# Patient Record
Sex: Male | Born: 1950
Health system: Southern US, Community
[De-identification: ages and names within clinical notes are randomized; demographics above are authoritative.]

## PROBLEM LIST (undated history)

## (undated) ENCOUNTER — Emergency Department (HOSPITAL_COMMUNITY): Admission: EM | Payer: Medicare Other | Source: Home / Self Care

## (undated) DIAGNOSIS — I10 Essential (primary) hypertension: Secondary | ICD-10-CM

## (undated) DIAGNOSIS — N200 Calculus of kidney: Secondary | ICD-10-CM

## (undated) DIAGNOSIS — E785 Hyperlipidemia, unspecified: Secondary | ICD-10-CM

## (undated) DIAGNOSIS — J189 Pneumonia, unspecified organism: Secondary | ICD-10-CM

## (undated) HISTORY — PX: LITHOTRIPSY: SUR834

## (undated) HISTORY — PX: WISDOM TOOTH EXTRACTION: SHX21

---

## 1999-06-07 ENCOUNTER — Encounter: Payer: Self-pay | Admitting: Emergency Medicine

## 1999-06-07 ENCOUNTER — Emergency Department (HOSPITAL_COMMUNITY): Admission: EM | Admit: 1999-06-07 | Discharge: 1999-06-07 | Payer: Self-pay | Admitting: Emergency Medicine

## 1999-06-09 ENCOUNTER — Emergency Department (HOSPITAL_COMMUNITY): Admission: EM | Admit: 1999-06-09 | Discharge: 1999-06-09 | Payer: Self-pay | Admitting: Emergency Medicine

## 1999-06-12 ENCOUNTER — Ambulatory Visit (HOSPITAL_COMMUNITY): Admission: RE | Admit: 1999-06-12 | Discharge: 1999-06-12 | Payer: Self-pay | Admitting: Urology

## 1999-06-12 ENCOUNTER — Encounter: Payer: Self-pay | Admitting: Urology

## 2000-10-02 ENCOUNTER — Emergency Department (HOSPITAL_COMMUNITY): Admission: EM | Admit: 2000-10-02 | Discharge: 2000-10-03 | Payer: Self-pay | Admitting: Emergency Medicine

## 2003-08-24 ENCOUNTER — Ambulatory Visit (HOSPITAL_COMMUNITY): Admission: RE | Admit: 2003-08-24 | Discharge: 2003-08-24 | Payer: Self-pay | Admitting: Family Medicine

## 2003-09-14 ENCOUNTER — Ambulatory Visit (HOSPITAL_COMMUNITY): Admission: RE | Admit: 2003-09-14 | Discharge: 2003-09-14 | Payer: Self-pay | Admitting: General Surgery

## 2011-02-14 ENCOUNTER — Encounter: Payer: Self-pay | Admitting: *Deleted

## 2011-02-14 ENCOUNTER — Emergency Department (HOSPITAL_COMMUNITY)
Admission: EM | Admit: 2011-02-14 | Discharge: 2011-02-14 | Disposition: A | Discharge status: Home / Self Care | Payer: BC Managed Care – PPO | Attending: Emergency Medicine | Admitting: Emergency Medicine

## 2011-02-14 ENCOUNTER — Emergency Department (HOSPITAL_COMMUNITY): Payer: BC Managed Care – PPO

## 2011-02-14 DIAGNOSIS — I10 Essential (primary) hypertension: Secondary | ICD-10-CM | POA: Insufficient documentation

## 2011-02-14 DIAGNOSIS — N200 Calculus of kidney: Secondary | ICD-10-CM

## 2011-02-14 DIAGNOSIS — E785 Hyperlipidemia, unspecified: Secondary | ICD-10-CM | POA: Insufficient documentation

## 2011-02-14 DIAGNOSIS — Z87891 Personal history of nicotine dependence: Secondary | ICD-10-CM | POA: Insufficient documentation

## 2011-02-14 HISTORY — DX: Hyperlipidemia, unspecified: E78.5

## 2011-02-14 HISTORY — DX: Calculus of kidney: N20.0

## 2011-02-14 HISTORY — DX: Essential (primary) hypertension: I10

## 2011-02-14 LAB — BASIC METABOLIC PANEL
GFR calc Af Amer: 71 mL/min — ABNORMAL LOW (ref >90)
GFR calc non Af Amer: 61 mL/min — ABNORMAL LOW (ref >90)
Glucose, Bld: 139 mg/dL — ABNORMAL HIGH (ref 70–99)
Potassium: 4.3 mEq/L (ref 3.5–5.1)
Sodium: 140 mEq/L (ref 135–145)

## 2011-02-14 LAB — DIFFERENTIAL
Basophils Relative: 0 % (ref 0–1)
Eosinophils Absolute: 0.1 10*3/uL (ref 0.0–0.7)
Lymphs Abs: 1.1 10*3/uL (ref 0.7–4.0)
Neutrophils Relative %: 80 % — ABNORMAL HIGH (ref 43–77)

## 2011-02-14 LAB — URINE MICROSCOPIC-ADD ON

## 2011-02-14 LAB — URINALYSIS, ROUTINE W REFLEX MICROSCOPIC
Nitrite: NEGATIVE
Specific Gravity, Urine: >1.03 — ABNORMAL HIGH (ref 1.005–1.030)
Urobilinogen, UA: 0.2 mg/dL (ref 0.0–1.0)

## 2011-02-14 LAB — CBC
MCH: 30.2 pg (ref 26.0–34.0)
Platelets: 184 10*3/uL (ref 150–400)
RBC: 4.7 MIL/uL (ref 4.22–5.81)

## 2011-02-14 MED ORDER — HYDROMORPHONE HCL PF 1 MG/ML IJ SOLN
1.0000 mg | Freq: Once | INTRAMUSCULAR | Status: AC
  Administered 2011-02-14: 1 mg via INTRAVENOUS
  Filled 2011-02-14: qty 1

## 2011-02-14 MED ORDER — ONDANSETRON HCL 4 MG/2ML IJ SOLN
4.0000 mg | Freq: Once | INTRAMUSCULAR | Status: AC
  Administered 2011-02-14: 4 mg via INTRAVENOUS
  Filled 2011-02-14: qty 2

## 2011-02-14 MED ORDER — ONDANSETRON HCL 8 MG PO TABS: 4.0000 mg | ORAL_TABLET | Freq: Four times a day (QID) | ORAL | Status: AC

## 2011-02-14 MED ORDER — SODIUM CHLORIDE 0.9 % IV BOLUS (SEPSIS)
500.0000 mL | Freq: Once | INTRAVENOUS | Status: AC
  Administered 2011-02-14: 500 mL via INTRAVENOUS

## 2011-02-14 MED ORDER — CIPROFLOXACIN HCL 250 MG PO TABS
500.0000 mg | ORAL_TABLET | Freq: Once | ORAL | Status: AC
  Administered 2011-02-14: 500 mg via ORAL
  Filled 2011-02-14: qty 2

## 2011-02-14 MED ORDER — CIPROFLOXACIN HCL 500 MG PO TABS: 500.0000 mg | ORAL_TABLET | Freq: Two times a day (BID) | ORAL | Status: AC

## 2011-02-14 MED ORDER — OXYCODONE-ACETAMINOPHEN 5-325 MG PO TABS: 1.0000 | ORAL_TABLET | ORAL | Status: AC | PRN

## 2011-02-14 NOTE — ED Notes (Signed)
Urine sample obtained and sent to lab with results pending.  Wife at bedside.  No needs voiced.

## 2011-02-14 NOTE — ED Notes (Signed)
EDP at bedside discussing plan of care for discharge.  Family verbalized understanding.

## 2011-02-14 NOTE — ED Notes (Signed)
Pt c/o right side flank pain. Pt states pain woke him from sleep.

## 2011-02-14 NOTE — ED Notes (Signed)
EDP at bedside discussing plan of care with pt and family.

## 2011-02-14 NOTE — ED Provider Notes (Signed)
History     CSN: [id number] Arrival date & time: 02/14/2011  9:33 AM   First MD Initiated Contact with Patient 02/14/11 [phone]      Chief Complaint  Patient presents with  . Flank Pain    (Consider location/radiation/quality/duration/timing/severity/associated sxs/prior treatment) HPI Comments: Patient c/o right flank pain that began suddenly this morning and woke him for sleep.  Also reports having nausea and vomiting x 1 earlier.  Describes the pain as sharp and radiating to his right lower abd and groin. Also had some urinary hesistancy this morning  Had a kidney stone approximately 10 years ago and this pain today feels similar.  He denies chest pain, diaphoresis, weakness, hematuria or numbness.    Patient is a 60 y.o. male presenting with flank pain. The history is provided by the patient.  Flank Pain This is a new problem. The current episode started today. The problem occurs constantly. The problem has been unchanged. Associated symptoms include abdominal pain, nausea, urinary symptoms and vomiting. Pertinent negatives include no arthralgias, chest pain, chills, coughing, diaphoresis, fatigue, fever, myalgias, neck pain, numbness, rash, sore throat or weakness. The symptoms are aggravated by nothing. He has tried nothing for the symptoms. The treatment provided no relief.    Past Medical History  Diagnosis Date  . Kidney stone   . Hyperlipidemia   . Hypertension     Past Surgical History  Procedure Date  . Lithotripsy     History reviewed. No pertinent family history.  History  Substance Use Topics  . Smoking status: Former Games developer  . Smokeless tobacco: Not on file  . Alcohol Use: No      Review of Systems  Constitutional: Negative for fever, chills, diaphoresis and fatigue.  HENT: Negative for sore throat, trouble swallowing, neck pain and neck stiffness.   Respiratory: Negative for cough, shortness of breath and wheezing.   Cardiovascular: Negative for chest  pain and palpitations.  Gastrointestinal: Positive for nausea, vomiting and abdominal pain. Negative for abdominal distention and rectal pain.  Genitourinary: Positive for flank pain, decreased urine volume and difficulty urinating. Negative for dysuria and hematuria.  Musculoskeletal: Negative for myalgias, back pain and arthralgias.  Skin: Negative.  Negative for rash.  Neurological: Negative for dizziness, weakness and numbness.  Hematological: Does not bruise/bleed easily.  All other systems reviewed and are negative.    Allergies  Review of patient's allergies indicates no known allergies.  Home Medications  No current outpatient prescriptions on file.  BP 121/83  Pulse 65  Temp(Src) 97.4 F (36.3 C) (Oral)  Resp 18  Ht 5\' 7"  (1.702 m)  Wt 210 lb (95.255 kg)  BMI 32.89 kg/m2  SpO2 97%  Physical Exam  Nursing note and vitals reviewed. Constitutional: He is oriented to person, place, and time. He appears well-developed and well-nourished. No distress.  HENT:  Head: Normocephalic and atraumatic.  Mouth/Throat: Oropharynx is clear and moist.  Neck: Normal range of motion. Neck supple.  Cardiovascular: Normal rate, regular rhythm and normal heart sounds.   Pulmonary/Chest: Effort normal and breath sounds normal. No respiratory distress. He exhibits no tenderness.  Abdominal: Soft. He exhibits no distension and no mass. There is no hepatosplenomegaly. There is no tenderness. There is CVA tenderness. There is no rebound and no guarding.  Musculoskeletal: Normal range of motion. He exhibits no edema and no tenderness.  Lymphadenopathy:    He has no cervical adenopathy.  Neurological: He is alert and oriented to person, place, and time. No cranial nerve  deficit. He exhibits normal muscle tone. Coordination normal.  Skin: Skin is warm and dry.    ED Course  Procedures (including critical care time)    Results for orders placed during the hospital encounter of 02/14/11    CBC      Component Value Range   WBC 8.4  4.0 - 10.5 (K/uL)   RBC 4.70  4.22 - 5.81 (MIL/uL)   Hemoglobin 14.2  13.0 - 17.0 (g/dL)   HCT 40.9  81.1 - 91.4 (%)   MCV 92.1  78.0 - 100.0 (fL)   MCH 30.2  26.0 - 34.0 (pg)   MCHC 32.8  30.0 - 36.0 (g/dL)   RDW 78.2  95.6 - 21.3 (%)   Platelets 184  150 - 400 (K/uL)  DIFFERENTIAL      Component Value Range   Neutrophils Relative 80 (*) 43 - 77 (%)   Neutro Abs 6.7  1.7 - 7.7 (K/uL)   Lymphocytes Relative 13  12 - 46 (%)   Lymphs Abs 1.1  0.7 - 4.0 (K/uL)   Monocytes Relative 6  3 - 12 (%)   Monocytes Absolute 0.5  0.1 - 1.0 (K/uL)   Eosinophils Relative 1  0 - 5 (%)   Eosinophils Absolute 0.1  0.0 - 0.7 (K/uL)   Basophils Relative 0  0 - 1 (%)   Basophils Absolute 0.0  0.0 - 0.1 (K/uL)  BASIC METABOLIC PANEL      Component Value Range   Sodium 140  135 - 145 (mEq/L)   Potassium 4.3  3.5 - 5.1 (mEq/L)   Chloride 103  96 - 112 (mEq/L)   CO2 28  19 - 32 (mEq/L)   Glucose, Bld 139 (*) 70 - 99 (mg/dL)   BUN 18  6 - 23 (mg/dL)   Creatinine, Ser 0.86  0.50 - 1.35 (mg/dL)   Calcium 9.7  8.4 - 57.8 (mg/dL)   GFR calc non Af Amer 61 (*) >90 (mL/min)   GFR calc Af Amer 71 (*) >90 (mL/min)  URINALYSIS, ROUTINE W REFLEX MICROSCOPIC      Component Value Range   Color, Urine YELLOW  YELLOW    APPearance CLEAR  CLEAR    Specific Gravity, Urine >1.030 (*) 1.005 - 1.030    pH 5.5  5.0 - 8.0    Glucose, UA NEGATIVE  NEGATIVE (mg/dL)   Hgb urine dipstick SMALL (*) NEGATIVE    Bilirubin Urine NEGATIVE  NEGATIVE    Ketones, ur TRACE (*) NEGATIVE (mg/dL)   Protein, ur NEGATIVE  NEGATIVE (mg/dL)   Urobilinogen, UA 0.2  0.0 - 1.0 (mg/dL)   Nitrite NEGATIVE  NEGATIVE    Leukocytes, UA NEGATIVE  NEGATIVE   URINE MICROSCOPIC-ADD ON      Component Value Range   WBC, UA 7-10  <3 (WBC/hpf)   RBC / HPF 7-10  <3 (RBC/hpf)   Bacteria, UA FEW (*) RARE      Ct Abdomen Pelvis Wo Contrast  02/14/2011  *RADIOLOGY REPORT*  Clinical Data: Right flank  pain  CT ABDOMEN AND PELVIS WITHOUT CONTRAST  Technique:  Multidetector CT imaging of the abdomen and pelvis was performed following the standard protocol without intravenous contrast.  Comparison: None.  Findings:  Renal:  There is  renal edema and mild hydronephrosis on the right. There is right hydroureter.  This is secondary to an obstructing calculus in the distal right ureter measuring 2 mm (image 84). This stone is approximately 1 cm from the right vesicoureteral  junction.  There is a cluster of three nonobstructing 1-2 mm calculi within lower pole of the right kidney.  Additional small calculi in the upper pole of the right kidney.  There is one small punctate calculus within the left kidney.  No evidence of left hydroureter or obstructive uropathy.  Lung bases are clear.  No pericardial fluid.  Non-IV contrast images demonstrate no focal hepatic lesion.  The gallbladder, pancreas, spleen, adrenal glands are normal.  The stomach, small bowel, appendix, and colon are normal.  Abdominal aorta normal caliber.  No retroperitoneal lymphadenopathy.  No free fluid the pelvis.  Bladder is decompressed.  Prostate gland is normal.  Review of  bone windows demonstrates no aggressive osseous lesions. Sclerotic lesions within the right sacrum appear benign.  IMPRESSION:  1. Small calculus within the distal right ureter with mild to moderate obstructive uropathy. 2.  Bilateral nephrolithiasis, right greater than left.  Original Report Authenticated By: Genevive Bi, M.D.     MDM    10:07 AM patient appears umcomfortable.  Has sudden onset of right flank pain this morning with hx of previous kidney stone.  Will address his pain, order labs and CT of abd/pelvis  12:04 PM patient feeling better.  Pain has improved.  Has a 2mm right stone and 7-10 WBC's and few bact. in his urine.  I will have him strain his urine, urine culture is pending.  Will give pain medications, anti-emetic and cipro.  He agrees to f/u with  his urologist    Patient / Family / Caregiver understand and agree with initial ED impression and plan with expectations set for ED visit.   Pt feels improved after observation and/or treatment in ED.         Terril Chestnut L. [place], [place] 02/14/11 2121

## 2011-02-15 LAB — URINE CULTURE

## 2011-02-15 NOTE — ED Provider Notes (Signed)
Medical screening examination/treatment/procedure(s) were performed by non-physician practitioner and as supervising physician I was immediately available for consultation/collaboration. Devoria Albe, MD, Armando Gang   Ward Givens, MD 02/15/11 [phone]

## 2016-07-28 DIAGNOSIS — E6609 Other obesity due to excess calories: Secondary | ICD-10-CM | POA: Diagnosis not present

## 2016-07-28 DIAGNOSIS — I10 Essential (primary) hypertension: Secondary | ICD-10-CM | POA: Diagnosis not present

## 2016-07-28 DIAGNOSIS — Z125 Encounter for screening for malignant neoplasm of prostate: Secondary | ICD-10-CM | POA: Diagnosis not present

## 2016-07-28 DIAGNOSIS — E782 Mixed hyperlipidemia: Secondary | ICD-10-CM | POA: Diagnosis not present

## 2016-07-28 DIAGNOSIS — Z6831 Body mass index (BMI) 31.0-31.9, adult: Secondary | ICD-10-CM | POA: Diagnosis not present

## 2016-07-28 DIAGNOSIS — Z0000 Encounter for general adult medical examination without abnormal findings: Secondary | ICD-10-CM | POA: Diagnosis not present

## 2016-07-28 DIAGNOSIS — Z1389 Encounter for screening for other disorder: Secondary | ICD-10-CM | POA: Diagnosis not present

## 2017-06-02 ENCOUNTER — Ambulatory Visit (HOSPITAL_COMMUNITY)
Admission: RE | Admit: 2017-06-02 | Discharge: 2017-06-02 | Disposition: A | Discharge status: Home / Self Care | Payer: Medicare Other | Source: Ambulatory Visit | Attending: Physician Assistant | Admitting: Physician Assistant

## 2017-06-02 ENCOUNTER — Other Ambulatory Visit (HOSPITAL_COMMUNITY): Payer: Self-pay | Admitting: Physician Assistant

## 2017-06-02 DIAGNOSIS — R0781 Pleurodynia: Secondary | ICD-10-CM

## 2017-06-02 DIAGNOSIS — X58XXXA Exposure to other specified factors, initial encounter: Secondary | ICD-10-CM | POA: Insufficient documentation

## 2017-06-02 DIAGNOSIS — Z6832 Body mass index (BMI) 32.0-32.9, adult: Secondary | ICD-10-CM | POA: Diagnosis not present

## 2017-06-02 DIAGNOSIS — J9811 Atelectasis: Secondary | ICD-10-CM | POA: Insufficient documentation

## 2017-06-02 DIAGNOSIS — E6609 Other obesity due to excess calories: Secondary | ICD-10-CM | POA: Diagnosis not present

## 2017-06-02 DIAGNOSIS — S2241XA Multiple fractures of ribs, right side, initial encounter for closed fracture: Secondary | ICD-10-CM | POA: Insufficient documentation

## 2017-06-02 DIAGNOSIS — Z1389 Encounter for screening for other disorder: Secondary | ICD-10-CM | POA: Diagnosis not present

## 2017-06-19 ENCOUNTER — Emergency Department (HOSPITAL_COMMUNITY): Payer: Medicare Other

## 2017-06-19 ENCOUNTER — Other Ambulatory Visit: Payer: Self-pay

## 2017-06-19 ENCOUNTER — Emergency Department (HOSPITAL_COMMUNITY)
Admission: EM | Admit: 2017-06-19 | Discharge: 2017-06-19 | Disposition: A | Discharge status: Home / Self Care | Payer: Medicare Other | Attending: Emergency Medicine | Admitting: Emergency Medicine

## 2017-06-19 ENCOUNTER — Encounter (HOSPITAL_COMMUNITY): Payer: Self-pay | Admitting: Student

## 2017-06-19 DIAGNOSIS — Z7982 Long term (current) use of aspirin: Secondary | ICD-10-CM | POA: Insufficient documentation

## 2017-06-19 DIAGNOSIS — Z87891 Personal history of nicotine dependence: Secondary | ICD-10-CM | POA: Diagnosis not present

## 2017-06-19 DIAGNOSIS — J189 Pneumonia, unspecified organism: Secondary | ICD-10-CM

## 2017-06-19 DIAGNOSIS — J181 Lobar pneumonia, unspecified organism: Secondary | ICD-10-CM | POA: Diagnosis not present

## 2017-06-19 DIAGNOSIS — X58XXXD Exposure to other specified factors, subsequent encounter: Secondary | ICD-10-CM | POA: Diagnosis not present

## 2017-06-19 DIAGNOSIS — S2241XD Multiple fractures of ribs, right side, subsequent encounter for fracture with routine healing: Secondary | ICD-10-CM | POA: Insufficient documentation

## 2017-06-19 DIAGNOSIS — R079 Chest pain, unspecified: Secondary | ICD-10-CM | POA: Diagnosis present

## 2017-06-19 DIAGNOSIS — S2241XA Multiple fractures of ribs, right side, initial encounter for closed fracture: Secondary | ICD-10-CM | POA: Diagnosis not present

## 2017-06-19 MED ORDER — AMOXICILLIN 500 MG PO CAPS: 1000.0000 mg | ORAL_CAPSULE | Freq: Two times a day (BID) | ORAL | 0 refills | Status: DC

## 2017-06-19 MED ORDER — AMOXICILLIN 250 MG PO CAPS
1000.0000 mg | ORAL_CAPSULE | Freq: Once | ORAL | Status: AC
  Administered 2017-06-19: 1000 mg via ORAL
  Filled 2017-06-19: qty 4

## 2017-06-19 NOTE — ED Provider Notes (Signed)
[facility] EMERGENCY DEPARTMENT Provider Note   CSN: [id number] Arrival date & time: 06/19/17  2110     History   Chief Complaint Chief Complaint  Patient presents with  . Chest Pain    HPI Frank Gilmore is a 67 y.o. male.  The history is provided by the patient.  Chest Pain    He has history of hypertension, hyperlipidemia, kidney stones, and had fallen 3 weeks ago suffering fractures to 2 ribs on his right side.  Over the last 2 days, he has developed fever to 100.9.  He is also been having chills and sweats at night.  There has been a cough which is nonproductive.  He does not have any true dyspnea, only pain when he takes a deep breath.  He denies nausea or vomiting.  He denies any sick contacts.  Past Medical History:  Diagnosis Date  . Hyperlipidemia   . Hypertension   . Kidney stone     There are no active problems to display for this patient.   Past Surgical History:  Procedure Laterality Date  . LITHOTRIPSY          Home Medications    Prior to Admission medications   Medication Sig Start Date End Date Taking? Authorizing Provider  amoxicillin (AMOXIL) 500 MG capsule Take 2 capsules (1,000 mg total) by mouth 2 (two) times daily. 08/15/58   Delora Fuel, MD  aspirin EC 81 MG tablet Take 81 mg by mouth daily.      [provider]  losartan (COZAAR) 100 MG tablet Take 100 mg by mouth daily.      [provider]  Multiple Vitamins-Minerals (MULTIVITAMINS THER. W/MINERALS) TABS Take 1 tablet by mouth daily.      [provider]  naproxen sodium (ANAPROX) 220 MG tablet Take 220 mg by mouth every 8 (eight) hours as needed. For pain     [provider]    Family History History reviewed. No pertinent family history.  Social History Social History   Tobacco Use  . Smoking status: Former Smoker  Substance Use Topics  . Alcohol use: No  . Drug use: No     Allergies   Patient has no known allergies.   Review of  Systems Review of Systems  Cardiovascular: Positive for chest pain.  All other systems reviewed and are negative.    Physical Exam Updated Vital Signs Pulse 93   Temp 98.9 F (37.2 C) (Oral)   Resp 20   Ht 5\' 9"  (1.753 m)   Wt 99.8 kg (220 lb)   SpO2 95%   BMI 32.49 kg/m   Physical Exam  Nursing note and vitals reviewed.  67 year old male, resting comfortably and in no acute distress. Vital signs are normal. Oxygen saturation is 95%, which is normal. Head is normocephalic and atraumatic. PERRLA, EOMI. Oropharynx is clear. Neck is nontender and supple without adenopathy or JVD. Back is nontender and there is no CVA tenderness. Lungs have rales at the right base without wheezes or rhonchi. Chest has moderate tenderness in the right lateral rib cage. Heart has regular rate and rhythm without murmur. Abdomen is soft, flat, nontender without masses or hepatosplenomegaly and peristalsis is normoactive. Extremities have no cyanosis or edema, full range of motion is present. Skin is warm and dry without rash. Neurologic: Mental status is normal, cranial nerves are intact, there are no motor or sensory deficits.  ED Treatments / Results   Radiology Dg Ribs Unilateral  W/chest Right  Result Date: 06/19/2017 CLINICAL DATA:  Broken ribs on 05/30/2017. Shortness of breath for 1 week. Fever, chills. EXAM: RIGHT RIBS AND CHEST - 3+ VIEW COMPARISON:  06/02/2017 FINDINGS: Shallow inspiration with linear atelectasis in the lung bases, increasing since previous study. Heart size and pulmonary vascularity are normal. No airspace disease in the lungs. No blunting of costophrenic angles. No pneumothorax. Mediastinal contours appear intact. Nondisplaced fractures are again demonstrated in the right seventh eighth and ninth ribs laterally. This corresponds to the area of patient's pain. No evidence of significant displacement since the previous study. No expansile or destructive rib lesions. IMPRESSION:  Shallow inspiration with linear atelectasis in the lung bases, increasing since previous study. Nondisplaced fractures again demonstrated in the right seventh, eighth, and ninth ribs laterally, similar to previous study. Electronically Signed   By: Lucienne Capers M.D.   On: 06/19/2017 22:17    Procedures Procedures  Medications Ordered in ED Medications  amoxicillin (AMOXIL) capsule 1,000 mg (1,000 mg Oral Given 06/19/17 2315)     Initial Impression / Assessment and Plan / ED Course  I have reviewed the triage vital signs and the nursing notes.  Pertinent imaging results that were available during my care of the patient were reviewed by me and considered in my medical decision making (see chart for details).  Fever and cough in patient with recent rib fracture.  Old records are reviewed confirming ED visit on March 17 for fall with 2 rib fractures on the right.  This is suspicious for pneumonia.  Rib x-rays were ordered from triage today, and are now showing evidence of fracture and 3 ribs and atelectasis at the bases.  I reviewed the images, and I feel that he actually has pneumonia at the right base.  He is nontoxic in appearance and maintaining good oxygen saturation on room air, so I do not feel he needs hospitalization.  He is given a prescription for amoxicillin and advised to continue routine care of rib fractures.  Also advised to take full, deep breaths several times a day to try to reduce atelectasis.  Given note to be off from work for 2 days, return if he develops high fever or develops dyspnea.  Final Clinical Impressions(s) / ED Diagnoses   Final diagnoses:  Community acquired pneumonia of right lower lobe of lung ([place])  Closed fracture of three ribs of right side, with routine healing, subsequent encounter    ED Discharge Orders        Ordered    amoxicillin (AMOXIL) 500 MG capsule  2 times daily     85/88/50 2774       Delora Fuel, MD 12/87/86 2319

## 2017-06-19 NOTE — ED Triage Notes (Addendum)
Pt reports broken ribs on 05/30/17. Pt c/o chills, fever with highest temp being 100.9, and SOB for 1 week. Pt took 4 ibuprofen around 1900. Pt reports chest congestion as well.

## 2017-06-19 NOTE — Discharge Instructions (Addendum)
Continue to take acetaminophen or ibuprofen for fever and pain. Several times a day, take a full, deep breat - even if it hurts.  Return if you develop a high fever, or if you are getting more short of breath.

## 2017-08-03 DIAGNOSIS — I10 Essential (primary) hypertension: Secondary | ICD-10-CM | POA: Diagnosis not present

## 2017-08-03 DIAGNOSIS — Z6832 Body mass index (BMI) 32.0-32.9, adult: Secondary | ICD-10-CM | POA: Diagnosis not present

## 2017-08-03 DIAGNOSIS — M79606 Pain in leg, unspecified: Secondary | ICD-10-CM | POA: Diagnosis not present

## 2017-08-03 DIAGNOSIS — E7800 Pure hypercholesterolemia, unspecified: Secondary | ICD-10-CM | POA: Diagnosis not present

## 2017-08-03 DIAGNOSIS — M25569 Pain in unspecified knee: Secondary | ICD-10-CM | POA: Diagnosis not present

## 2017-08-13 DIAGNOSIS — N429 Disorder of prostate, unspecified: Secondary | ICD-10-CM | POA: Diagnosis not present

## 2017-08-13 DIAGNOSIS — Z79899 Other long term (current) drug therapy: Secondary | ICD-10-CM | POA: Diagnosis not present

## 2017-08-17 DIAGNOSIS — Z6832 Body mass index (BMI) 32.0-32.9, adult: Secondary | ICD-10-CM | POA: Diagnosis not present

## 2017-08-17 DIAGNOSIS — Z87442 Personal history of urinary calculi: Secondary | ICD-10-CM | POA: Diagnosis not present

## 2017-08-17 DIAGNOSIS — E7800 Pure hypercholesterolemia, unspecified: Secondary | ICD-10-CM | POA: Diagnosis not present

## 2017-08-17 DIAGNOSIS — I10 Essential (primary) hypertension: Secondary | ICD-10-CM | POA: Diagnosis not present

## 2017-08-17 DIAGNOSIS — M25569 Pain in unspecified knee: Secondary | ICD-10-CM | POA: Diagnosis not present

## 2017-08-17 DIAGNOSIS — M5432 Sciatica, left side: Secondary | ICD-10-CM | POA: Diagnosis not present

## 2017-08-17 DIAGNOSIS — M79606 Pain in leg, unspecified: Secondary | ICD-10-CM | POA: Diagnosis not present

## 2017-11-08 DIAGNOSIS — E7800 Pure hypercholesterolemia, unspecified: Secondary | ICD-10-CM | POA: Diagnosis not present

## 2017-11-08 DIAGNOSIS — Z6832 Body mass index (BMI) 32.0-32.9, adult: Secondary | ICD-10-CM | POA: Diagnosis not present

## 2017-11-08 DIAGNOSIS — I10 Essential (primary) hypertension: Secondary | ICD-10-CM | POA: Diagnosis not present

## 2017-11-08 DIAGNOSIS — M25569 Pain in unspecified knee: Secondary | ICD-10-CM | POA: Diagnosis not present

## 2017-11-08 DIAGNOSIS — Z125 Encounter for screening for malignant neoplasm of prostate: Secondary | ICD-10-CM | POA: Diagnosis not present

## 2017-11-08 DIAGNOSIS — M79606 Pain in leg, unspecified: Secondary | ICD-10-CM | POA: Diagnosis not present

## 2017-11-12 DIAGNOSIS — E7800 Pure hypercholesterolemia, unspecified: Secondary | ICD-10-CM | POA: Diagnosis not present

## 2017-11-12 DIAGNOSIS — I10 Essential (primary) hypertension: Secondary | ICD-10-CM | POA: Diagnosis not present

## 2017-11-12 DIAGNOSIS — M5432 Sciatica, left side: Secondary | ICD-10-CM | POA: Diagnosis not present

## 2017-11-12 DIAGNOSIS — M79605 Pain in left leg: Secondary | ICD-10-CM | POA: Diagnosis not present

## 2017-11-12 DIAGNOSIS — Z6832 Body mass index (BMI) 32.0-32.9, adult: Secondary | ICD-10-CM | POA: Diagnosis not present

## 2017-11-30 ENCOUNTER — Ambulatory Visit: Payer: No Typology Code available for payment source

## 2017-11-30 ENCOUNTER — Telehealth: Payer: Self-pay

## 2017-11-30 NOTE — Telephone Encounter (Signed)
noted 

## 2017-11-30 NOTE — Telephone Encounter (Signed)
PATIENT WAS A NO SHOW AND LETTER SENT  °

## 2018-07-01 DIAGNOSIS — E782 Mixed hyperlipidemia: Secondary | ICD-10-CM | POA: Diagnosis not present

## 2018-07-01 DIAGNOSIS — I10 Essential (primary) hypertension: Secondary | ICD-10-CM | POA: Diagnosis not present

## 2018-08-10 DIAGNOSIS — Z0000 Encounter for general adult medical examination without abnormal findings: Secondary | ICD-10-CM | POA: Diagnosis not present

## 2018-09-10 ENCOUNTER — Emergency Department (HOSPITAL_COMMUNITY): Payer: Medicare Other

## 2018-09-10 ENCOUNTER — Encounter (HOSPITAL_COMMUNITY): Payer: Self-pay | Admitting: Emergency Medicine

## 2018-09-10 ENCOUNTER — Other Ambulatory Visit: Payer: Self-pay

## 2018-09-10 ENCOUNTER — Emergency Department (HOSPITAL_COMMUNITY)
Admission: EM | Admit: 2018-09-10 | Discharge: 2018-09-10 | Disposition: A | Discharge status: Home / Self Care | Payer: Medicare Other | Attending: Emergency Medicine | Admitting: Emergency Medicine

## 2018-09-10 DIAGNOSIS — R319 Hematuria, unspecified: Secondary | ICD-10-CM | POA: Insufficient documentation

## 2018-09-10 DIAGNOSIS — S2241XA Multiple fractures of ribs, right side, initial encounter for closed fracture: Secondary | ICD-10-CM | POA: Diagnosis not present

## 2018-09-10 DIAGNOSIS — S32038A Other fracture of third lumbar vertebra, initial encounter for closed fracture: Secondary | ICD-10-CM | POA: Diagnosis not present

## 2018-09-10 DIAGNOSIS — I10 Essential (primary) hypertension: Secondary | ICD-10-CM | POA: Insufficient documentation

## 2018-09-10 DIAGNOSIS — Z79899 Other long term (current) drug therapy: Secondary | ICD-10-CM | POA: Diagnosis not present

## 2018-09-10 DIAGNOSIS — S32009A Unspecified fracture of unspecified lumbar vertebra, initial encounter for closed fracture: Secondary | ICD-10-CM

## 2018-09-10 DIAGNOSIS — Y93H9 Activity, other involving exterior property and land maintenance, building and construction: Secondary | ICD-10-CM | POA: Insufficient documentation

## 2018-09-10 DIAGNOSIS — Z23 Encounter for immunization: Secondary | ICD-10-CM | POA: Insufficient documentation

## 2018-09-10 DIAGNOSIS — Z7982 Long term (current) use of aspirin: Secondary | ICD-10-CM | POA: Diagnosis not present

## 2018-09-10 DIAGNOSIS — Z87891 Personal history of nicotine dependence: Secondary | ICD-10-CM | POA: Insufficient documentation

## 2018-09-10 DIAGNOSIS — W11XXXA Fall on and from ladder, initial encounter: Secondary | ICD-10-CM | POA: Insufficient documentation

## 2018-09-10 DIAGNOSIS — Y999 Unspecified external cause status: Secondary | ICD-10-CM | POA: Diagnosis not present

## 2018-09-10 DIAGNOSIS — S3991XA Unspecified injury of abdomen, initial encounter: Secondary | ICD-10-CM | POA: Diagnosis not present

## 2018-09-10 DIAGNOSIS — S32019A Unspecified fracture of first lumbar vertebra, initial encounter for closed fracture: Secondary | ICD-10-CM | POA: Diagnosis not present

## 2018-09-10 DIAGNOSIS — S32018A Other fracture of first lumbar vertebra, initial encounter for closed fracture: Secondary | ICD-10-CM | POA: Diagnosis not present

## 2018-09-10 DIAGNOSIS — Y92007 Garden or yard of unspecified non-institutional (private) residence as the place of occurrence of the external cause: Secondary | ICD-10-CM | POA: Insufficient documentation

## 2018-09-10 DIAGNOSIS — S32028A Other fracture of second lumbar vertebra, initial encounter for closed fracture: Secondary | ICD-10-CM | POA: Diagnosis not present

## 2018-09-10 LAB — COMPREHENSIVE METABOLIC PANEL
ALT: 68 U/L — ABNORMAL HIGH (ref 0–44)
AST: 73 U/L — ABNORMAL HIGH (ref 15–41)
Albumin: 4.7 g/dL (ref 3.5–5.0)
Alkaline Phosphatase: 68 U/L (ref 38–126)
Anion gap: 13 (ref 5–15)
BUN: 20 mg/dL (ref 8–23)
CO2: 23 mmol/L (ref 22–32)
Calcium: 9.3 mg/dL (ref 8.9–10.3)
Chloride: 103 mmol/L (ref 98–111)
Creatinine, Ser: 1.29 mg/dL — ABNORMAL HIGH (ref 0.61–1.24)
GFR calc Af Amer: >60 mL/min (ref >60)
GFR calc non Af Amer: 57 mL/min — ABNORMAL LOW (ref >60)
Glucose, Bld: 115 mg/dL — ABNORMAL HIGH (ref 70–99)
Potassium: 4.1 mmol/L (ref 3.5–5.1)
Sodium: 139 mmol/L (ref 135–145)
Total Bilirubin: 0.7 mg/dL (ref 0.3–1.2)
Total Protein: 7.7 g/dL (ref 6.5–8.1)

## 2018-09-10 LAB — CBC WITH DIFFERENTIAL/PLATELET
Abs Immature Granulocytes: 0.04 10*3/uL (ref 0.00–0.07)
Basophils Absolute: 0.1 10*3/uL (ref 0.0–0.1)
Basophils Relative: 1 %
Eosinophils Absolute: 0.1 10*3/uL (ref 0.0–0.5)
Eosinophils Relative: 1 %
HCT: 46.5 % (ref 39.0–52.0)
Hemoglobin: 15.2 g/dL (ref 13.0–17.0)
Immature Granulocytes: 0 %
Lymphocytes Relative: 11 %
Lymphs Abs: 1.4 10*3/uL (ref 0.7–4.0)
MCH: 31.3 pg (ref 26.0–34.0)
MCHC: 32.7 g/dL (ref 30.0–36.0)
MCV: 95.7 fL (ref 80.0–100.0)
Monocytes Absolute: 0.9 10*3/uL (ref 0.1–1.0)
Monocytes Relative: 6 %
Neutro Abs: 10.9 10*3/uL — ABNORMAL HIGH (ref 1.7–7.7)
Neutrophils Relative %: 81 %
Platelets: 149 10*3/uL — ABNORMAL LOW (ref 150–400)
RBC: 4.86 MIL/uL (ref 4.22–5.81)
RDW: 13.1 % (ref 11.5–15.5)
WBC: 13.3 10*3/uL — ABNORMAL HIGH (ref 4.0–10.5)
nRBC: 0 % (ref 0.0–0.2)

## 2018-09-10 LAB — URINALYSIS, ROUTINE W REFLEX MICROSCOPIC
Bilirubin Urine: NEGATIVE
Glucose, UA: NEGATIVE mg/dL
Ketones, ur: NEGATIVE mg/dL
Leukocytes,Ua: NEGATIVE
Nitrite: NEGATIVE
Protein, ur: NEGATIVE mg/dL
RBC / HPF: >50 RBC/hpf — ABNORMAL HIGH (ref 0–5)
Specific Gravity, Urine: 1.024 (ref 1.005–1.030)
pH: 5 (ref 5.0–8.0)

## 2018-09-10 MED ORDER — OXYCODONE-ACETAMINOPHEN 5-325 MG PO TABS: 1.0000 | ORAL_TABLET | ORAL | 0 refills | Status: DC | PRN

## 2018-09-10 MED ORDER — TETANUS-DIPHTH-ACELL PERTUSSIS 5-2.5-18.5 LF-MCG/0.5 IM SUSP
0.5000 mL | Freq: Once | INTRAMUSCULAR | Status: AC
  Administered 2018-09-10: 0.5 mL via INTRAMUSCULAR
  Filled 2018-09-10: qty 0.5

## 2018-09-10 MED ORDER — IOHEXOL 300 MG/ML  SOLN
100.0000 mL | Freq: Once | INTRAMUSCULAR | Status: AC | PRN
  Administered 2018-09-10: 100 mL via INTRAVENOUS

## 2018-09-10 NOTE — ED Notes (Signed)
Order addition/change noted   Frank Gilmore in to speak w pt

## 2018-09-10 NOTE — ED Notes (Signed)
Pt to restroom

## 2018-09-10 NOTE — ED Notes (Signed)
Pt with abrasion to R lower back   Ambulates heel to toe  Alert  Denies urinary frequency or other complaints

## 2018-09-10 NOTE — ED Notes (Signed)
Pt is speaking w spouse regarding care

## 2018-09-10 NOTE — ED Triage Notes (Addendum)
Patient c/o right mid to right flank pain after falling from 2 step on ladder landing on wooden deck chair. Per patient felt pop afterwards while taking a shower in right flank. Denies any shortness of breath, nausea, or vomiting. Denies hitting head or LOC. Denies taking any type of anticoagulants. No complications with BMs or urination. CNS intact. Ambulatory.

## 2018-09-10 NOTE — ED Provider Notes (Signed)
1650  Pt signed out to me by Evalee Jefferson, PA-C at end of shift.  CT chest and abd/pelvis still pending.    Pt has right flank pain secondary to a fall from a ladder.  Missed one step and fell back landing against a wooden seat on his deck.  No tachycardia, dyspnea.  He is resting comfortably.  Vitals reviewed.     Ct Chest W Contrast  Result Date: 09/10/2018 CLINICAL DATA:  68 year old male with right flank pain after falling from a ladder onto a wooden deck chair. EXAM: CT CHEST, ABDOMEN, AND PELVIS WITH CONTRAST TECHNIQUE: Multidetector CT imaging of the chest, abdomen and pelvis was performed following the standard protocol during bolus administration of intravenous contrast. CONTRAST:  126mL OMNIPAQUE IOHEXOL 300 MG/ML  SOLN COMPARISON:  Prior CT abdomen/pelvis 02/14/2011 FINDINGS: CT CHEST FINDINGS Cardiovascular: Conventional 3 vessel aortic arch. No evidence of aneurysm or dissection. The heart is normal in size. No pericardial effusion. Calcifications are present along the left main and left anterior descending coronary artery. Normal caliber main pulmonary artery. No PE. Mediastinum/Nodes: Unremarkable CT appearance of the thyroid gland. No suspicious mediastinal or hilar adenopathy. No soft tissue mediastinal mass. The thoracic esophagus is unremarkable. Lungs/Pleura: Lungs are clear. No pleural effusion or pneumothorax. Musculoskeletal: Acute minimally displaced fractures of the lateral aspect of the right seventh rib in the anterolateral aspect of the right eighth rib. Subtle nondisplaced fracture of the lateral aspect of the right ninth rib. Nondisplaced fracture of the neck of the right tenth rib with a displaced fracture of the posterolateral aspect. Fracture displaced by 1 full rib width. Similarly, there is a nondisplaced fracture of the rib of the right eleventh rib with a more significantly displaced fracture of the posterolateral aspect. The fracture is displaced by 2 rib widths. CT  ABDOMEN PELVIS FINDINGS Hepatobiliary: Diffuse low attenuation of the hepatic parenchyma consistent with hepatic steatosis. No evidence of hepatic contusion. Gallbladder is unremarkable. No intra or extrahepatic biliary ductal dilatation. Pancreas: Unremarkable. No pancreatic ductal dilatation or surrounding inflammatory changes. Spleen: Normal in size without focal abnormality. Adrenals/Urinary Tract: Normal adrenal glands. Multifocal bilateral nephrolithiasis. At least 9 stones are present on the right with the largest measuring 7 mm. On the left, there are at least 5 small punctate stones. Multifocal renal cortical scarring on the left. No evidence of hydronephrosis or enhancing renal mass. The ureters and bladder are unremarkable. Stomach/Bowel: Normal appendix in the right lower quadrant. No focal bowel wall thickening or evidence of obstruction. There are a few scattered colonic diverticula. Vascular/Lymphatic: Mild aortic atherosclerosis. No aneurysm or dissection. No focal venous abnormality. Reproductive: Prostate is unremarkable. Other: No evidence of ascites, retroperitoneal or peritoneal hemorrhage. Mild stranding in the subcutaneous fat overlying the right flank consistent with contusion. Musculoskeletal: Nondisplaced fracture of the right transverse process of L1. IMPRESSION: 1. Multiple right-sided rib fractures with single fractures of right ribs 7, 8 and 9 and segmental fractures of right ribs 10 and 11 with displacement of the posterolateral fracture sites. 2. Nondisplaced fracture of the right transverse process of L1. 3. Soft tissue contusion overlying the right flank. 4. No evidence of pneumothorax, subpleural hematoma, retroperitoneal or peritoneal hematoma. No solid organ injury. 5. Coronary artery calcifications including left main and left anterior descending. Please note that although the presence of coronary artery calcium documents the presence of coronary artery disease, the severity of  this disease and any potential stenosis cannot be assessed on this non-gated CT examination. Assessment for  potential risk factor modification, dietary therapy or pharmacologic therapy may be warranted, if clinically indicated. 6. Hepatic steatosis. 7. Bilateral nonobstructing nephrolithiasis. The largest stone in the right interpolar region measures up to 7 mm. 8. Partially duplicated right renal collecting system. 9.  Aortic Atherosclerosis (ICD10-170.0) Electronically Signed   By: Jacqulynn Cadet M.D.   On: 09/10/2018 17:28   Ct Abdomen Pelvis W Contrast  Result Date: 09/10/2018 CLINICAL DATA:  68 year old male with right flank pain after falling from a ladder onto a wooden deck chair. EXAM: CT CHEST, ABDOMEN, AND PELVIS WITH CONTRAST TECHNIQUE: Multidetector CT imaging of the chest, abdomen and pelvis was performed following the standard protocol during bolus administration of intravenous contrast. CONTRAST:  134mL OMNIPAQUE IOHEXOL 300 MG/ML  SOLN COMPARISON:  Prior CT abdomen/pelvis 02/14/2011 FINDINGS: CT CHEST FINDINGS Cardiovascular: Conventional 3 vessel aortic arch. No evidence of aneurysm or dissection. The heart is normal in size. No pericardial effusion. Calcifications are present along the left main and left anterior descending coronary artery. Normal caliber main pulmonary artery. No PE. Mediastinum/Nodes: Unremarkable CT appearance of the thyroid gland. No suspicious mediastinal or hilar adenopathy. No soft tissue mediastinal mass. The thoracic esophagus is unremarkable. Lungs/Pleura: Lungs are clear. No pleural effusion or pneumothorax. Musculoskeletal: Acute minimally displaced fractures of the lateral aspect of the right seventh rib in the anterolateral aspect of the right eighth rib. Subtle nondisplaced fracture of the lateral aspect of the right ninth rib. Nondisplaced fracture of the neck of the right tenth rib with a displaced fracture of the posterolateral aspect. Fracture displaced by  1 full rib width. Similarly, there is a nondisplaced fracture of the rib of the right eleventh rib with a more significantly displaced fracture of the posterolateral aspect. The fracture is displaced by 2 rib widths. CT ABDOMEN PELVIS FINDINGS Hepatobiliary: Diffuse low attenuation of the hepatic parenchyma consistent with hepatic steatosis. No evidence of hepatic contusion. Gallbladder is unremarkable. No intra or extrahepatic biliary ductal dilatation. Pancreas: Unremarkable. No pancreatic ductal dilatation or surrounding inflammatory changes. Spleen: Normal in size without focal abnormality. Adrenals/Urinary Tract: Normal adrenal glands. Multifocal bilateral nephrolithiasis. At least 9 stones are present on the right with the largest measuring 7 mm. On the left, there are at least 5 small punctate stones. Multifocal renal cortical scarring on the left. No evidence of hydronephrosis or enhancing renal mass. The ureters and bladder are unremarkable. Stomach/Bowel: Normal appendix in the right lower quadrant. No focal bowel wall thickening or evidence of obstruction. There are a few scattered colonic diverticula. Vascular/Lymphatic: Mild aortic atherosclerosis. No aneurysm or dissection. No focal venous abnormality. Reproductive: Prostate is unremarkable. Other: No evidence of ascites, retroperitoneal or peritoneal hemorrhage. Mild stranding in the subcutaneous fat overlying the right flank consistent with contusion. Musculoskeletal: Nondisplaced fracture of the right transverse process of L1. IMPRESSION: 1. Multiple right-sided rib fractures with single fractures of right ribs 7, 8 and 9 and segmental fractures of right ribs 10 and 11 with displacement of the posterolateral fracture sites. 2. Nondisplaced fracture of the right transverse process of L1. 3. Soft tissue contusion overlying the right flank. 4. No evidence of pneumothorax, subpleural hematoma, retroperitoneal or peritoneal hematoma. No solid organ  injury. 5. Coronary artery calcifications including left main and left anterior descending. Please note that although the presence of coronary artery calcium documents the presence of coronary artery disease, the severity of this disease and any potential stenosis cannot be assessed on this non-gated CT examination. Assessment for potential risk factor modification, dietary  therapy or pharmacologic therapy may be warranted, if clinically indicated. 6. Hepatic steatosis. 7. Bilateral nonobstructing nephrolithiasis. The largest stone in the right interpolar region measures up to 7 mm. 8. Partially duplicated right renal collecting system. 9.  Aortic Atherosclerosis (ICD10-170.0) Electronically Signed   By: Jacqulynn Cadet M.D.   On: 09/10/2018 17:28     Discussed CT findings with Dr. Roderic Palau and with the pt.  He is ambulatory.  Declined pain medication here.  I feel that he is appropriate for d/c home, incentive spirometer dispensed.  He agrees to close PCP f/u in 2 days and also advised to f/u with urology as well.  Pt verbalized understanding and agrees to plan.  Strict return precautions also discussed.       Kem Parkinson, PA-C 09/10/18 Marko Stai    Milton Ferguson, MD 09/13/18 1302

## 2018-09-10 NOTE — ED Notes (Signed)
Call to CT to ascertain when pt will go

## 2018-09-10 NOTE — ED Notes (Signed)
From Rad 

## 2018-09-10 NOTE — ED Notes (Signed)
Awaiting CT read.

## 2018-09-10 NOTE — ED Provider Notes (Signed)
[facility] Provider Note   CSN: [id number] Arrival date & time: 09/10/18  1318    History   Chief Complaint Chief Complaint  Patient presents with  . Fall    HPI Frank Gilmore is a 68 y.o. male with a history of hypertension, hyperlipidemia and history of kidney stones presenting with right flank pain after falling from a ladder.  He was power washing his house, standing on a ladder and he thought he was stepping off the bottom ladder rung, instead was on the second from the bottom and he fell backwards landing with his right flank against a wooden bench edge.  He has localized pain and abrasion to the site including pain with deep inspiration, he denies shortness of breath, dizziness, also no nausea or vomiting.  He denies abdominal pain.  He has had no treatment prior to arrival.  The injury occurred approximately 2 hours before arriving here.  He was taking a shower when he felt a popping sensation in the right flank and is concerned for possible rib fractures.  He denies hitting his head, denies headache, neck pain also no chest pain or shortness of breath.  He has had no treatment prior to arrival.     The history is provided by the patient.    Past Medical History:  Diagnosis Date  . Hyperlipidemia   . Hypertension   . Kidney stone     There are no active problems to display for this patient.   Past Surgical History:  Procedure Laterality Date  . LITHOTRIPSY          Home Medications    Prior to Admission medications   Medication Sig Start Date End Date Taking? Authorizing Provider  aspirin EC 81 MG tablet Take 81 mg by mouth daily.     Yes [provider]  hydrochlorothiazide (HYDRODIURIL) 12.5 MG tablet Take 12.5 mg by mouth daily. 07/01/18  Yes [provider]  losartan (COZAAR) 100 MG tablet Take 100 mg by mouth daily.     Yes [provider]  naproxen sodium (ANAPROX) 220 MG tablet Take 220 mg by mouth every 8  (eight) hours as needed. For pain    Yes [provider]  pravastatin (PRAVACHOL) 20 MG tablet Take 20 mg by mouth daily. 07/13/18  Yes [provider]  amoxicillin (AMOXIL) 500 MG capsule Take 2 capsules (1,000 mg total) by mouth 2 (two) times daily. 08/15/93   Delora Fuel, MD  Multiple Vitamins-Minerals (MULTIVITAMINS THER. W/MINERALS) TABS Take 1 tablet by mouth daily.      [provider]    Family History No family history on file.  Social History Social History   Tobacco Use  . Smoking status: Former Smoker    Types: Cigarettes  . Smokeless tobacco: Never Used  Substance Use Topics  . Alcohol use: No  . Drug use: No     Allergies   Patient has no known allergies.   Review of Systems Review of Systems  Constitutional: Negative for chills and fever.  HENT: Negative.   Eyes: Negative.   Respiratory: Negative for chest tightness and shortness of breath.   Cardiovascular: Negative for chest pain.  Gastrointestinal: Negative for abdominal pain, nausea and vomiting.  Genitourinary: Positive for flank pain.  Musculoskeletal: Negative for arthralgias, joint swelling and neck pain.  Skin: Negative.  Negative for rash and wound.  Neurological: Negative for dizziness, weakness, light-headedness, numbness and headaches.  Psychiatric/Behavioral: Negative.  Physical Exam Updated Vital Signs BP 111/77 (BP Location: Right Arm)   Pulse 71   Temp 98.2 F (36.8 C) (Oral)   Resp 18   Ht 5\' 8"  (1.727 m)   Wt 95.3 kg   SpO2 99%   BMI 31.93 kg/m   Physical Exam Vitals signs and nursing note reviewed.  Constitutional:      Appearance: He is well-developed.  HENT:     Head: Normocephalic and atraumatic.  Eyes:     Conjunctiva/sclera: Conjunctivae normal.  Neck:     Musculoskeletal: Normal range of motion.  Cardiovascular:     Rate and Rhythm: Normal rate and regular rhythm.     Heart sounds: Normal heart sounds.  Pulmonary:     Effort:  Pulmonary effort is normal.     Breath sounds: Normal breath sounds. No wheezing.  Abdominal:     General: Bowel sounds are normal. There is no distension.     Palpations: Abdomen is soft.     Tenderness: There is no abdominal tenderness. There is right CVA tenderness. There is no guarding.     Comments: Long abrasion noted over right flank.  No crepitus. No palpable rib deformity.  Lumbar and thoracic nontender.  Musculoskeletal: Normal range of motion.  Skin:    General: Skin is warm and dry.  Neurological:     Mental Status: He is alert.      ED Treatments / Results  Labs (all labs ordered are listed, but only abnormal results are displayed) Labs Reviewed  URINALYSIS, ROUTINE W REFLEX MICROSCOPIC - Abnormal; Notable for the following components:      Result Value   Hgb urine dipstick MODERATE (*)    RBC / HPF >50 (*)    Bacteria, UA RARE (*)    All other components within normal limits  CBC WITH DIFFERENTIAL/PLATELET - Abnormal; Notable for the following components:   WBC 13.3 (*)    Platelets 149 (*)    Neutro Abs 10.9 (*)    All other components within normal limits  COMPREHENSIVE METABOLIC PANEL - Abnormal; Notable for the following components:   Glucose, Bld 115 (*)    Creatinine, Ser 1.29 (*)    AST 73 (*)    ALT 68 (*)    GFR calc non Af Amer 57 (*)    All other components within normal limits    EKG None  Radiology No results found.  Procedures Procedures (including critical care time)  Medications Ordered in ED Medications  Tdap (BOOSTRIX) injection 0.5 mL (has no administration in time range)     Initial Impression / Assessment and Plan / ED Course  I have reviewed the triage vital signs and the nursing notes.  Pertinent labs & imaging results that were available during my care of the patient were reviewed by me and considered in my medical decision making (see chart for details).        Labs reviewed, creatinine elevated but stable in  comparison to prior.  Pending Ct imaging to assess for kidney trauma/rib fractures.  Pt offered pain medicine, but deferred.  Tetanus updated.  Discussed with Frank Parkinson, PA who assumes care.   Final Clinical Impressions(s) / ED Diagnoses   Final diagnoses:  None    ED Discharge Orders    None       Landis Martins 09/10/18 1637    Milton Ferguson, MD 09/13/18 1303

## 2018-09-10 NOTE — ED Notes (Signed)
To Rad 

## 2018-09-10 NOTE — ED Notes (Signed)
Missed IV attempt

## 2018-09-10 NOTE — ED Notes (Signed)
TT in to discuss w pt

## 2018-09-10 NOTE — Discharge Instructions (Signed)
Try to avoid bending, twisting and lifting.  Use the spirometer as directed to help to keep your lungs clear.  Follow-up with your primary doctor on Monday for recheck.  Also, call the urologist listed to arrange a follow-up appt as well.  Return here for any worsening symptoms

## 2018-09-12 MED FILL — Oxycodone w/ Acetaminophen Tab 5-325 MG: ORAL | Qty: 6 | Status: AC

## 2018-09-13 DIAGNOSIS — S2241XD Multiple fractures of ribs, right side, subsequent encounter for fracture with routine healing: Secondary | ICD-10-CM | POA: Diagnosis not present

## 2018-09-13 DIAGNOSIS — N200 Calculus of kidney: Secondary | ICD-10-CM | POA: Diagnosis not present

## 2018-10-14 ENCOUNTER — Other Ambulatory Visit: Payer: Self-pay

## 2018-10-20 DIAGNOSIS — H6123 Impacted cerumen, bilateral: Secondary | ICD-10-CM | POA: Diagnosis not present

## 2018-10-20 DIAGNOSIS — H9123 Sudden idiopathic hearing loss, bilateral: Secondary | ICD-10-CM | POA: Diagnosis not present

## 2018-10-24 DIAGNOSIS — E782 Mixed hyperlipidemia: Secondary | ICD-10-CM | POA: Diagnosis not present

## 2018-10-24 DIAGNOSIS — I10 Essential (primary) hypertension: Secondary | ICD-10-CM | POA: Diagnosis not present

## 2018-10-27 DIAGNOSIS — N183 Chronic kidney disease, stage 3 (moderate): Secondary | ICD-10-CM | POA: Diagnosis not present

## 2018-10-27 DIAGNOSIS — M79606 Pain in leg, unspecified: Secondary | ICD-10-CM | POA: Diagnosis not present

## 2018-10-27 DIAGNOSIS — Z87442 Personal history of urinary calculi: Secondary | ICD-10-CM | POA: Diagnosis not present

## 2018-10-27 DIAGNOSIS — I10 Essential (primary) hypertension: Secondary | ICD-10-CM | POA: Diagnosis not present

## 2018-10-27 DIAGNOSIS — Z23 Encounter for immunization: Secondary | ICD-10-CM | POA: Diagnosis not present

## 2018-10-27 DIAGNOSIS — E7800 Pure hypercholesterolemia, unspecified: Secondary | ICD-10-CM | POA: Diagnosis not present

## 2018-10-27 DIAGNOSIS — Z6832 Body mass index (BMI) 32.0-32.9, adult: Secondary | ICD-10-CM | POA: Diagnosis not present

## 2018-10-27 DIAGNOSIS — M5432 Sciatica, left side: Secondary | ICD-10-CM | POA: Diagnosis not present

## 2018-10-27 DIAGNOSIS — R0782 Intercostal pain: Secondary | ICD-10-CM | POA: Diagnosis not present

## 2018-11-21 DIAGNOSIS — Z1211 Encounter for screening for malignant neoplasm of colon: Secondary | ICD-10-CM | POA: Diagnosis not present

## 2018-11-21 DIAGNOSIS — Z1212 Encounter for screening for malignant neoplasm of rectum: Secondary | ICD-10-CM | POA: Diagnosis not present

## 2019-02-06 ENCOUNTER — Other Ambulatory Visit: Payer: Self-pay

## 2019-03-24 ENCOUNTER — Other Ambulatory Visit: Payer: Medicare Other

## 2019-03-24 ENCOUNTER — Other Ambulatory Visit: Payer: Self-pay | Admitting: Physician Assistant

## 2019-03-24 DIAGNOSIS — L03114 Cellulitis of left upper limb: Secondary | ICD-10-CM

## 2019-05-01 DIAGNOSIS — E782 Mixed hyperlipidemia: Secondary | ICD-10-CM | POA: Diagnosis not present

## 2019-05-01 DIAGNOSIS — Z6832 Body mass index (BMI) 32.0-32.9, adult: Secondary | ICD-10-CM | POA: Diagnosis not present

## 2019-05-01 DIAGNOSIS — Z0000 Encounter for general adult medical examination without abnormal findings: Secondary | ICD-10-CM | POA: Diagnosis not present

## 2019-05-01 DIAGNOSIS — M79606 Pain in leg, unspecified: Secondary | ICD-10-CM | POA: Diagnosis not present

## 2019-05-01 DIAGNOSIS — Z87442 Personal history of urinary calculi: Secondary | ICD-10-CM | POA: Diagnosis not present

## 2019-05-01 DIAGNOSIS — I10 Essential (primary) hypertension: Secondary | ICD-10-CM | POA: Diagnosis not present

## 2019-05-01 DIAGNOSIS — M5432 Sciatica, left side: Secondary | ICD-10-CM | POA: Diagnosis not present

## 2019-05-01 DIAGNOSIS — R7301 Impaired fasting glucose: Secondary | ICD-10-CM | POA: Diagnosis not present

## 2019-05-01 DIAGNOSIS — Z125 Encounter for screening for malignant neoplasm of prostate: Secondary | ICD-10-CM | POA: Diagnosis not present

## 2019-05-01 DIAGNOSIS — E7800 Pure hypercholesterolemia, unspecified: Secondary | ICD-10-CM | POA: Diagnosis not present

## 2019-05-01 DIAGNOSIS — M25569 Pain in unspecified knee: Secondary | ICD-10-CM | POA: Diagnosis not present

## 2019-05-01 DIAGNOSIS — M79605 Pain in left leg: Secondary | ICD-10-CM | POA: Diagnosis not present

## 2019-05-05 DIAGNOSIS — E782 Mixed hyperlipidemia: Secondary | ICD-10-CM | POA: Diagnosis not present

## 2019-05-05 DIAGNOSIS — R7301 Impaired fasting glucose: Secondary | ICD-10-CM | POA: Diagnosis not present

## 2019-05-05 DIAGNOSIS — D72829 Elevated white blood cell count, unspecified: Secondary | ICD-10-CM | POA: Diagnosis not present

## 2019-05-05 DIAGNOSIS — I129 Hypertensive chronic kidney disease with stage 1 through stage 4 chronic kidney disease, or unspecified chronic kidney disease: Secondary | ICD-10-CM | POA: Diagnosis not present

## 2019-05-05 DIAGNOSIS — N182 Chronic kidney disease, stage 2 (mild): Secondary | ICD-10-CM | POA: Diagnosis not present

## 2019-05-05 DIAGNOSIS — R7303 Prediabetes: Secondary | ICD-10-CM | POA: Diagnosis not present

## 2019-06-05 IMAGING — DX DG RIBS W/ CHEST 3+V*R*
5 series · 5 of 5 positions shown · non-contrast
Comparison: Chest radiograph 08/24/2003.

CLINICAL DATA: 66-year-old male status post fall 3 days ago with
right anterior rib pain. Pleuritic pain.

EXAM:
RIGHT RIBS AND CHEST - 3+ VIEW

[chest pa]
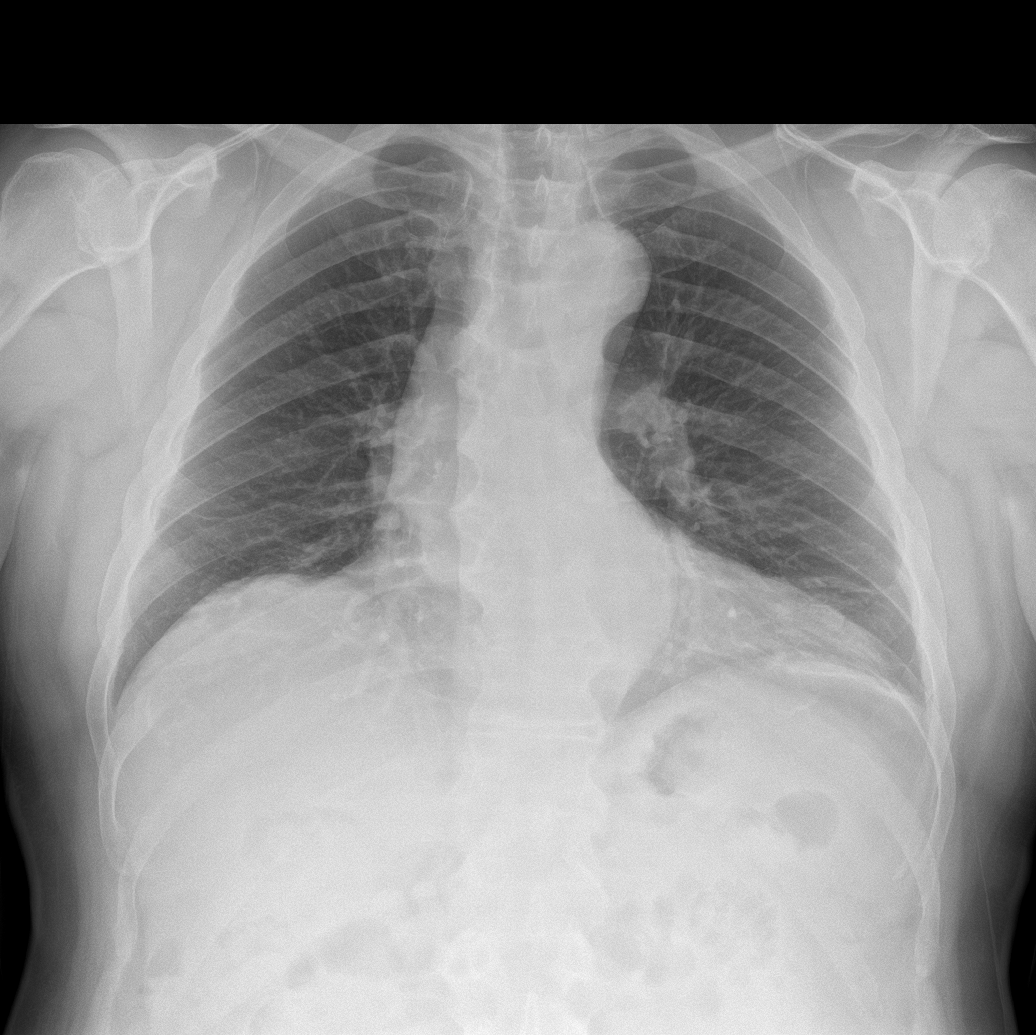

[rib pa (1 of 2)]
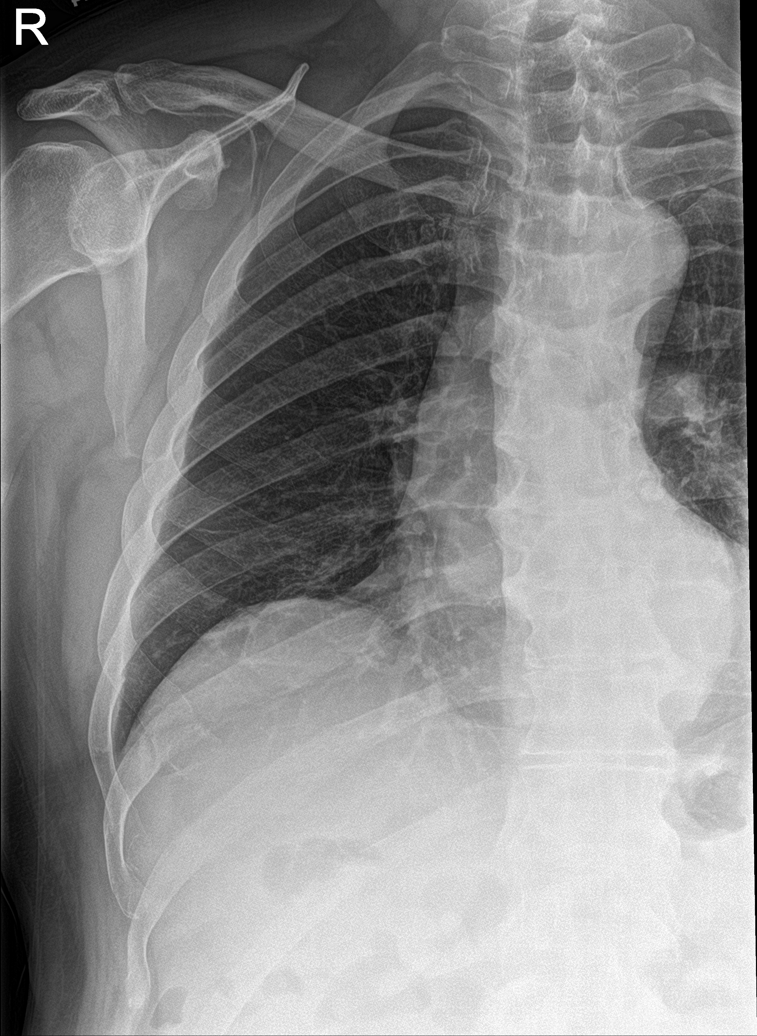

[rib pa obl (1 of 2)]
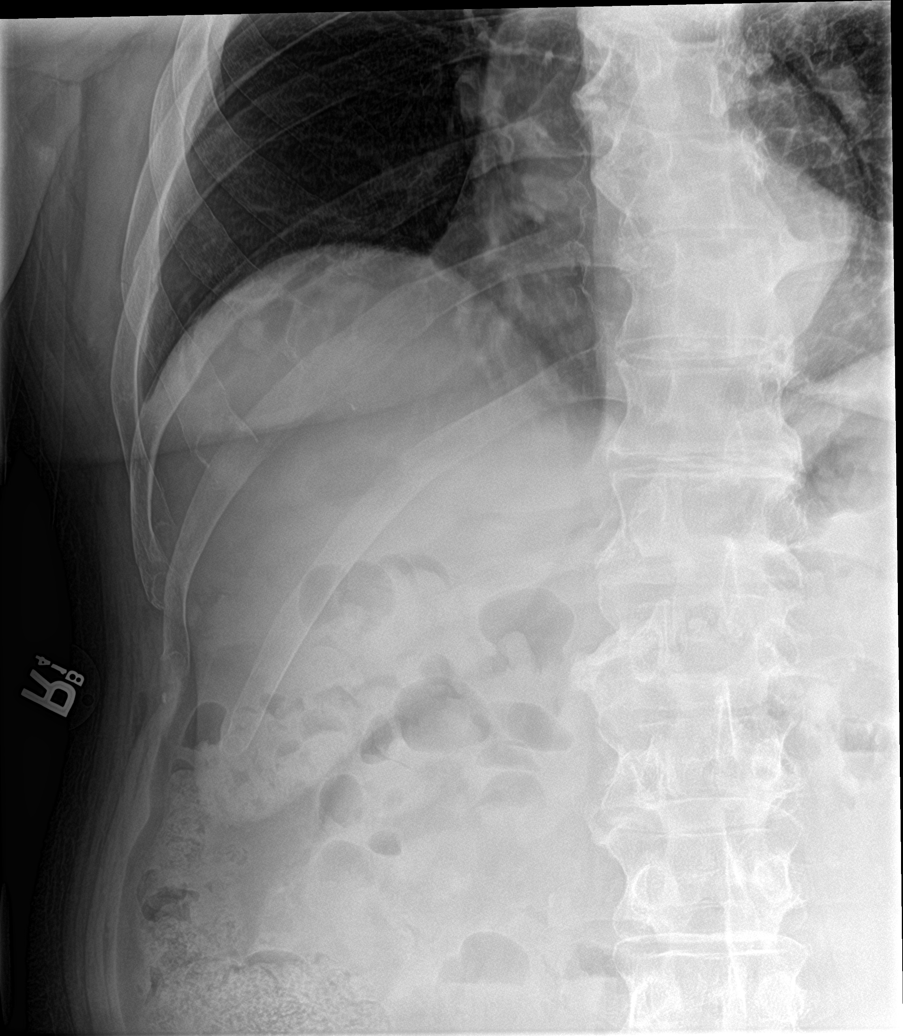

[rib pa obl (2 of 2)]
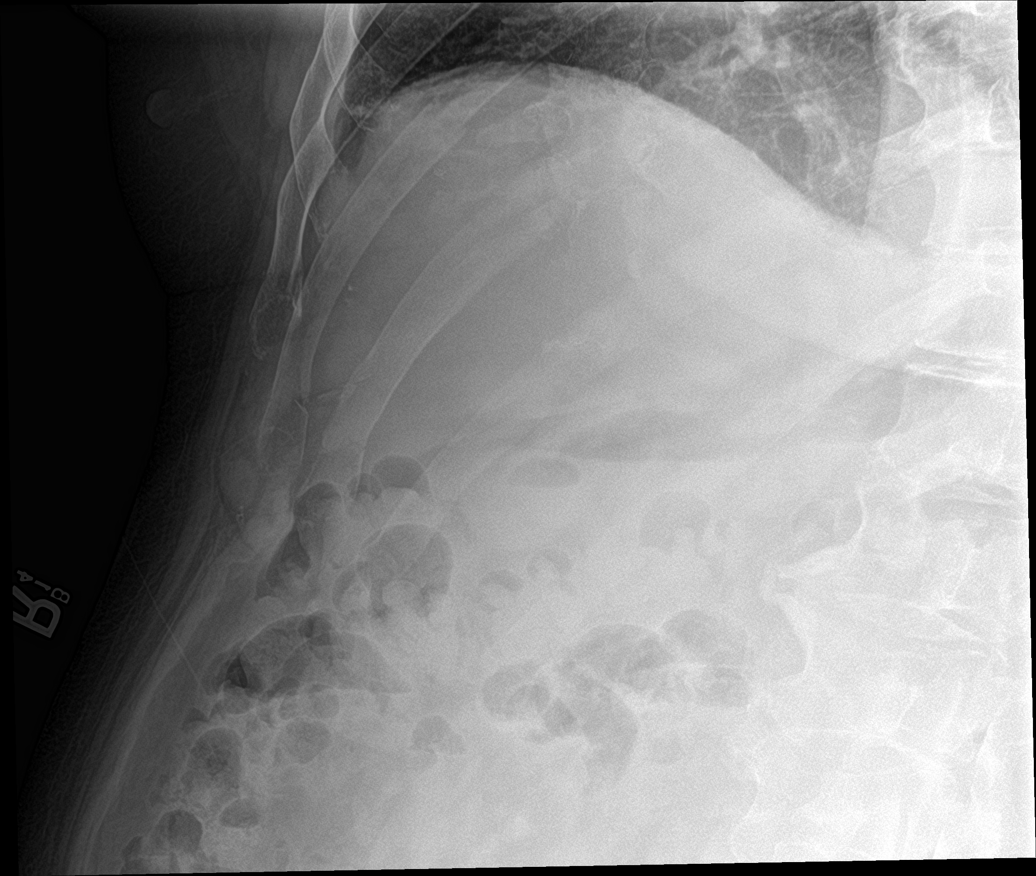

[rib pa (2 of 2)]
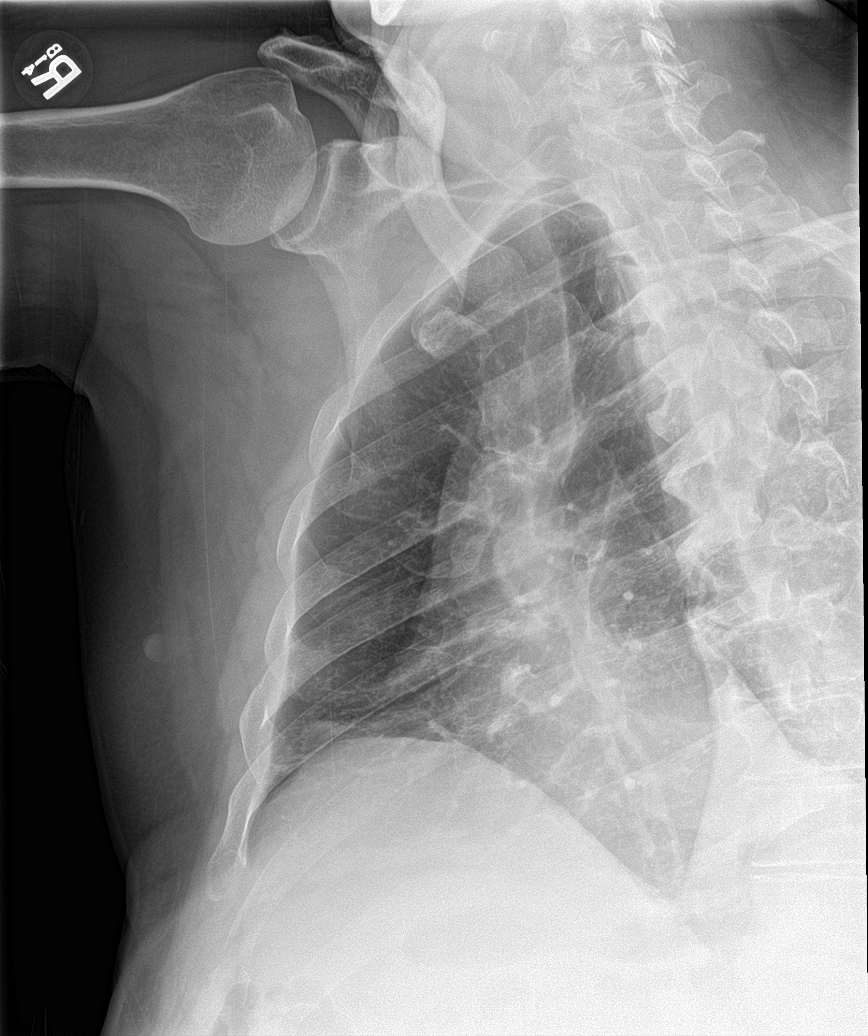

[5 of 5 positions shown; findings below may reference images not displayed]

FINDINGS: Stable lung volumes. Stable cardiac size and mediastinal contours.
Visualized tracheal air column is within normal limits. No
pneumothorax, pulmonary edema, pleural effusion or consolidation.
Mild streaky left lung base opacity most resembles atelectasis.

Bone mineralization is within normal limits for age. There are
nondisplaced fractures of both the right anterolateral 7th and 8th
rib suspected (images 3, 4). Possible superimposed nondisplaced
right anterior 8th rib fracture near the costochondral junction
(image 5). Other visible osseous structures appear intact. Negative
visible bowel gas pattern.
IMPRESSION: 1. Nondisplaced fractures of the right 7th and 8th ribs. No
associated pneumothorax or pleural effusion.
2. Mild left lung base atelectasis.

## 2019-12-01 DIAGNOSIS — Z23 Encounter for immunization: Secondary | ICD-10-CM | POA: Diagnosis not present

## 2020-04-05 DIAGNOSIS — J0100 Acute maxillary sinusitis, unspecified: Secondary | ICD-10-CM | POA: Diagnosis not present

## 2020-04-05 DIAGNOSIS — Z20822 Contact with and (suspected) exposure to covid-19: Secondary | ICD-10-CM | POA: Diagnosis not present

## 2020-04-05 DIAGNOSIS — R051 Acute cough: Secondary | ICD-10-CM | POA: Diagnosis not present

## 2020-09-10 DIAGNOSIS — I10 Essential (primary) hypertension: Secondary | ICD-10-CM | POA: Diagnosis not present

## 2020-09-10 DIAGNOSIS — R7301 Impaired fasting glucose: Secondary | ICD-10-CM | POA: Diagnosis not present

## 2020-09-26 DIAGNOSIS — J069 Acute upper respiratory infection, unspecified: Secondary | ICD-10-CM | POA: Diagnosis not present

## 2020-09-26 DIAGNOSIS — N182 Chronic kidney disease, stage 2 (mild): Secondary | ICD-10-CM | POA: Diagnosis not present

## 2020-09-26 DIAGNOSIS — I10 Essential (primary) hypertension: Secondary | ICD-10-CM | POA: Diagnosis not present

## 2020-09-26 DIAGNOSIS — Z87442 Personal history of urinary calculi: Secondary | ICD-10-CM | POA: Diagnosis not present

## 2020-09-26 DIAGNOSIS — Z125 Encounter for screening for malignant neoplasm of prostate: Secondary | ICD-10-CM | POA: Diagnosis not present

## 2020-09-26 DIAGNOSIS — E782 Mixed hyperlipidemia: Secondary | ICD-10-CM | POA: Diagnosis not present

## 2020-09-26 DIAGNOSIS — R7303 Prediabetes: Secondary | ICD-10-CM | POA: Diagnosis not present

## 2021-02-20 DIAGNOSIS — Z20822 Contact with and (suspected) exposure to covid-19: Secondary | ICD-10-CM | POA: Diagnosis not present

## 2021-04-01 DIAGNOSIS — Z125 Encounter for screening for malignant neoplasm of prostate: Secondary | ICD-10-CM | POA: Diagnosis not present

## 2021-04-01 DIAGNOSIS — R7303 Prediabetes: Secondary | ICD-10-CM | POA: Diagnosis not present

## 2021-04-01 DIAGNOSIS — E782 Mixed hyperlipidemia: Secondary | ICD-10-CM | POA: Diagnosis not present

## 2021-04-08 DIAGNOSIS — Z125 Encounter for screening for malignant neoplasm of prostate: Secondary | ICD-10-CM | POA: Diagnosis not present

## 2021-04-08 DIAGNOSIS — N182 Chronic kidney disease, stage 2 (mild): Secondary | ICD-10-CM | POA: Diagnosis not present

## 2021-04-08 DIAGNOSIS — R7303 Prediabetes: Secondary | ICD-10-CM | POA: Diagnosis not present

## 2021-04-08 DIAGNOSIS — I10 Essential (primary) hypertension: Secondary | ICD-10-CM | POA: Diagnosis not present

## 2021-04-08 DIAGNOSIS — Z87442 Personal history of urinary calculi: Secondary | ICD-10-CM | POA: Diagnosis not present

## 2021-04-08 DIAGNOSIS — E782 Mixed hyperlipidemia: Secondary | ICD-10-CM | POA: Diagnosis not present

## 2021-04-25 DIAGNOSIS — Z20822 Contact with and (suspected) exposure to covid-19: Secondary | ICD-10-CM | POA: Diagnosis not present

## 2021-05-12 DIAGNOSIS — Z20822 Contact with and (suspected) exposure to covid-19: Secondary | ICD-10-CM | POA: Diagnosis not present

## 2021-06-17 DIAGNOSIS — Z20822 Contact with and (suspected) exposure to covid-19: Secondary | ICD-10-CM | POA: Diagnosis not present

## 2021-06-19 DIAGNOSIS — Z20822 Contact with and (suspected) exposure to covid-19: Secondary | ICD-10-CM | POA: Diagnosis not present

## 2021-06-30 DIAGNOSIS — Z20822 Contact with and (suspected) exposure to covid-19: Secondary | ICD-10-CM | POA: Diagnosis not present

## 2021-07-21 DIAGNOSIS — Z20822 Contact with and (suspected) exposure to covid-19: Secondary | ICD-10-CM | POA: Diagnosis not present

## 2021-08-07 DIAGNOSIS — E782 Mixed hyperlipidemia: Secondary | ICD-10-CM | POA: Diagnosis not present

## 2021-08-07 DIAGNOSIS — R7303 Prediabetes: Secondary | ICD-10-CM | POA: Diagnosis not present

## 2021-08-14 DIAGNOSIS — Z87442 Personal history of urinary calculi: Secondary | ICD-10-CM | POA: Diagnosis not present

## 2021-08-14 DIAGNOSIS — N182 Chronic kidney disease, stage 2 (mild): Secondary | ICD-10-CM | POA: Diagnosis not present

## 2021-08-14 DIAGNOSIS — Z6832 Body mass index (BMI) 32.0-32.9, adult: Secondary | ICD-10-CM | POA: Diagnosis not present

## 2021-08-14 DIAGNOSIS — E669 Obesity, unspecified: Secondary | ICD-10-CM | POA: Diagnosis not present

## 2021-08-14 DIAGNOSIS — Z125 Encounter for screening for malignant neoplasm of prostate: Secondary | ICD-10-CM | POA: Diagnosis not present

## 2021-08-14 DIAGNOSIS — E782 Mixed hyperlipidemia: Secondary | ICD-10-CM | POA: Diagnosis not present

## 2021-08-14 DIAGNOSIS — R7303 Prediabetes: Secondary | ICD-10-CM | POA: Diagnosis not present

## 2021-08-14 DIAGNOSIS — L570 Actinic keratosis: Secondary | ICD-10-CM | POA: Diagnosis not present

## 2021-08-14 DIAGNOSIS — Z1211 Encounter for screening for malignant neoplasm of colon: Secondary | ICD-10-CM | POA: Diagnosis not present

## 2021-08-14 DIAGNOSIS — I10 Essential (primary) hypertension: Secondary | ICD-10-CM | POA: Diagnosis not present

## 2021-08-19 ENCOUNTER — Encounter: Payer: Self-pay | Admitting: *Deleted

## 2021-09-11 DIAGNOSIS — X32XXXA Exposure to sunlight, initial encounter: Secondary | ICD-10-CM | POA: Diagnosis not present

## 2021-09-11 DIAGNOSIS — D225 Melanocytic nevi of trunk: Secondary | ICD-10-CM | POA: Diagnosis not present

## 2021-09-11 DIAGNOSIS — B078 Other viral warts: Secondary | ICD-10-CM | POA: Diagnosis not present

## 2021-09-11 DIAGNOSIS — L821 Other seborrheic keratosis: Secondary | ICD-10-CM | POA: Diagnosis not present

## 2021-09-11 DIAGNOSIS — L570 Actinic keratosis: Secondary | ICD-10-CM | POA: Diagnosis not present

## 2022-02-17 ENCOUNTER — Encounter: Payer: Self-pay | Admitting: *Deleted

## 2022-09-05 ENCOUNTER — Emergency Department (HOSPITAL_COMMUNITY): Payer: Medicare Other

## 2022-09-05 ENCOUNTER — Other Ambulatory Visit: Payer: Self-pay

## 2022-09-05 ENCOUNTER — Inpatient Hospital Stay (HOSPITAL_COMMUNITY)
Admission: EM | Admit: 2022-09-05 | Discharge: 2022-09-08 | DRG: 445 | Disposition: A | Discharge status: Home / Self Care | Payer: Medicare Other | Attending: Internal Medicine | Admitting: Internal Medicine

## 2022-09-05 ENCOUNTER — Inpatient Hospital Stay (HOSPITAL_COMMUNITY): Payer: Medicare Other

## 2022-09-05 ENCOUNTER — Encounter (HOSPITAL_COMMUNITY): Payer: Self-pay | Admitting: Emergency Medicine

## 2022-09-05 DIAGNOSIS — K521 Toxic gastroenteritis and colitis: Secondary | ICD-10-CM | POA: Diagnosis present

## 2022-09-05 DIAGNOSIS — N132 Hydronephrosis with renal and ureteral calculous obstruction: Secondary | ICD-10-CM | POA: Diagnosis present

## 2022-09-05 DIAGNOSIS — N179 Acute kidney failure, unspecified: Secondary | ICD-10-CM | POA: Diagnosis not present

## 2022-09-05 DIAGNOSIS — T3695XA Adverse effect of unspecified systemic antibiotic, initial encounter: Secondary | ICD-10-CM | POA: Diagnosis present

## 2022-09-05 DIAGNOSIS — Z87442 Personal history of urinary calculi: Secondary | ICD-10-CM | POA: Diagnosis not present

## 2022-09-05 DIAGNOSIS — Z79899 Other long term (current) drug therapy: Secondary | ICD-10-CM | POA: Diagnosis not present

## 2022-09-05 DIAGNOSIS — Z6833 Body mass index (BMI) 33.0-33.9, adult: Secondary | ICD-10-CM

## 2022-09-05 DIAGNOSIS — R109 Unspecified abdominal pain: Secondary | ICD-10-CM | POA: Diagnosis not present

## 2022-09-05 DIAGNOSIS — K819 Cholecystitis, unspecified: Secondary | ICD-10-CM

## 2022-09-05 DIAGNOSIS — N202 Calculus of kidney with calculus of ureter: Secondary | ICD-10-CM | POA: Diagnosis not present

## 2022-09-05 DIAGNOSIS — N201 Calculus of ureter: Secondary | ICD-10-CM | POA: Diagnosis not present

## 2022-09-05 DIAGNOSIS — E669 Obesity, unspecified: Secondary | ICD-10-CM | POA: Diagnosis not present

## 2022-09-05 DIAGNOSIS — Z7982 Long term (current) use of aspirin: Secondary | ICD-10-CM

## 2022-09-05 DIAGNOSIS — K810 Acute cholecystitis: Secondary | ICD-10-CM | POA: Diagnosis not present

## 2022-09-05 DIAGNOSIS — K838 Other specified diseases of biliary tract: Secondary | ICD-10-CM | POA: Diagnosis not present

## 2022-09-05 DIAGNOSIS — N1339 Other hydronephrosis: Secondary | ICD-10-CM | POA: Diagnosis not present

## 2022-09-05 DIAGNOSIS — Z87891 Personal history of nicotine dependence: Secondary | ICD-10-CM | POA: Diagnosis not present

## 2022-09-05 DIAGNOSIS — T360X5A Adverse effect of penicillins, initial encounter: Secondary | ICD-10-CM | POA: Diagnosis present

## 2022-09-05 DIAGNOSIS — E785 Hyperlipidemia, unspecified: Secondary | ICD-10-CM | POA: Diagnosis present

## 2022-09-05 DIAGNOSIS — I10 Essential (primary) hypertension: Secondary | ICD-10-CM | POA: Diagnosis present

## 2022-09-05 DIAGNOSIS — K8000 Calculus of gallbladder with acute cholecystitis without obstruction: Secondary | ICD-10-CM | POA: Diagnosis not present

## 2022-09-05 DIAGNOSIS — K8001 Calculus of gallbladder with acute cholecystitis with obstruction: Secondary | ICD-10-CM | POA: Diagnosis not present

## 2022-09-05 DIAGNOSIS — K8020 Calculus of gallbladder without cholecystitis without obstruction: Secondary | ICD-10-CM | POA: Diagnosis not present

## 2022-09-05 DIAGNOSIS — N1330 Unspecified hydronephrosis: Secondary | ICD-10-CM | POA: Diagnosis not present

## 2022-09-05 DIAGNOSIS — R188 Other ascites: Secondary | ICD-10-CM | POA: Diagnosis not present

## 2022-09-05 DIAGNOSIS — N134 Hydroureter: Secondary | ICD-10-CM | POA: Diagnosis not present

## 2022-09-05 DIAGNOSIS — K828 Other specified diseases of gallbladder: Secondary | ICD-10-CM | POA: Diagnosis not present

## 2022-09-05 LAB — CBC WITH DIFFERENTIAL/PLATELET
Abs Immature Granulocytes: 0.08 10*3/uL — ABNORMAL HIGH (ref 0.00–0.07)
Basophils Absolute: 0.1 10*3/uL (ref 0.0–0.1)
Basophils Relative: 0 %
Eosinophils Absolute: 0.3 10*3/uL (ref 0.0–0.5)
Eosinophils Relative: 2 %
HCT: 40.1 % (ref 39.0–52.0)
Hemoglobin: 13.3 g/dL (ref 13.0–17.0)
Immature Granulocytes: 1 %
Lymphocytes Relative: 10 %
Lymphs Abs: 1.6 10*3/uL (ref 0.7–4.0)
MCH: 31.3 pg (ref 26.0–34.0)
MCHC: 33.2 g/dL (ref 30.0–36.0)
MCV: 94.4 fL (ref 80.0–100.0)
Monocytes Absolute: 1.8 10*3/uL — ABNORMAL HIGH (ref 0.1–1.0)
Monocytes Relative: 11 %
Neutro Abs: 11.6 10*3/uL — ABNORMAL HIGH (ref 1.7–7.7)
Neutrophils Relative %: 76 %
Platelets: 160 10*3/uL (ref 150–400)
RBC: 4.25 MIL/uL (ref 4.22–5.81)
RDW: 13.2 % (ref 11.5–15.5)
WBC: 15.3 10*3/uL — ABNORMAL HIGH (ref 4.0–10.5)
nRBC: 0 % (ref 0.0–0.2)

## 2022-09-05 LAB — URINALYSIS, ROUTINE W REFLEX MICROSCOPIC
Bacteria, UA: NONE SEEN
Bilirubin Urine: NEGATIVE
Glucose, UA: NEGATIVE mg/dL
Ketones, ur: 5 mg/dL — AB
Leukocytes,Ua: NEGATIVE
Nitrite: NEGATIVE
Protein, ur: 30 mg/dL — AB
Specific Gravity, Urine: 1.02 (ref 1.005–1.030)
pH: 5 (ref 5.0–8.0)

## 2022-09-05 LAB — COMPREHENSIVE METABOLIC PANEL
ALT: 27 U/L (ref 0–44)
AST: 28 U/L (ref 15–41)
Albumin: 3.8 g/dL (ref 3.5–5.0)
Alkaline Phosphatase: 54 U/L (ref 38–126)
Anion gap: 10 (ref 5–15)
BUN: 27 mg/dL — ABNORMAL HIGH (ref 8–23)
CO2: 22 mmol/L (ref 22–32)
Calcium: 9 mg/dL (ref 8.9–10.3)
Chloride: 105 mmol/L (ref 98–111)
Creatinine, Ser: 1.87 mg/dL — ABNORMAL HIGH (ref 0.61–1.24)
GFR, Estimated: 38 mL/min — ABNORMAL LOW (ref >60)
Glucose, Bld: 135 mg/dL — ABNORMAL HIGH (ref 70–99)
Potassium: 4 mmol/L (ref 3.5–5.1)
Sodium: 137 mmol/L (ref 135–145)
Total Bilirubin: 1 mg/dL (ref 0.3–1.2)
Total Protein: 7.3 g/dL (ref 6.5–8.1)

## 2022-09-05 LAB — LIPASE, BLOOD: Lipase: 31 U/L (ref 11–51)

## 2022-09-05 MED ORDER — HYDROMORPHONE HCL 1 MG/ML IJ SOLN
1.0000 mg | Freq: Once | INTRAMUSCULAR | Status: AC
  Administered 2022-09-05: 1 mg via INTRAVENOUS
  Filled 2022-09-05: qty 1

## 2022-09-05 MED ORDER — TAMSULOSIN HCL 0.4 MG PO CAPS
0.4000 mg | ORAL_CAPSULE | Freq: Every day | ORAL | Status: DC
  Administered 2022-09-05 – 2022-09-08 (×4): 0.4 mg via ORAL
  Filled 2022-09-05 (×4): qty 1

## 2022-09-05 MED ORDER — ONDANSETRON HCL 4 MG/2ML IJ SOLN
4.0000 mg | Freq: Three times a day (TID) | INTRAMUSCULAR | Status: DC | PRN
  Filled 2022-09-05: qty 2

## 2022-09-05 MED ORDER — GADOBUTROL 1 MMOL/ML IV SOLN
10.0000 mL | Freq: Once | INTRAVENOUS | Status: AC | PRN
  Administered 2022-09-05: 10 mL via INTRAVENOUS

## 2022-09-05 MED ORDER — ORAL CARE MOUTH RINSE: 15.0000 mL | OROMUCOSAL | Status: DC | PRN

## 2022-09-05 MED ORDER — ONDANSETRON HCL 4 MG/2ML IJ SOLN
4.0000 mg | Freq: Once | INTRAMUSCULAR | Status: AC
  Administered 2022-09-05: 4 mg via INTRAVENOUS
  Filled 2022-09-05: qty 2

## 2022-09-05 MED ORDER — HYDRALAZINE HCL 20 MG/ML IJ SOLN: 5.0000 mg | Freq: Three times a day (TID) | INTRAMUSCULAR | Status: DC | PRN

## 2022-09-05 MED ORDER — ONDANSETRON HCL 4 MG/2ML IJ SOLN
4.0000 mg | Freq: Once | INTRAMUSCULAR | Status: AC | PRN
  Administered 2022-09-05: 4 mg via INTRAVENOUS
  Filled 2022-09-05: qty 2

## 2022-09-05 MED ORDER — SODIUM CHLORIDE 0.9 % IV BOLUS
1000.0000 mL | Freq: Once | INTRAVENOUS | Status: AC
  Administered 2022-09-05: 1000 mL via INTRAVENOUS

## 2022-09-05 MED ORDER — MORPHINE SULFATE (PF) 4 MG/ML IV SOLN
4.0000 mg | Freq: Once | INTRAVENOUS | Status: AC
  Administered 2022-09-05: 4 mg via INTRAVENOUS
  Filled 2022-09-05: qty 1

## 2022-09-05 MED ORDER — ENOXAPARIN SODIUM 40 MG/0.4ML IJ SOSY
40.0000 mg | PREFILLED_SYRINGE | INTRAMUSCULAR | Status: DC
  Administered 2022-09-05: 40 mg via SUBCUTANEOUS
  Filled 2022-09-05: qty 0.4

## 2022-09-05 MED ORDER — SODIUM CHLORIDE 0.9 % IV SOLN: INTRAVENOUS | Status: DC

## 2022-09-05 MED ORDER — PIPERACILLIN-TAZOBACTAM 3.375 G IVPB 30 MIN
3.3750 g | Freq: Once | INTRAVENOUS | Status: AC
  Administered 2022-09-05: 3.375 g via INTRAVENOUS
  Filled 2022-09-05: qty 50

## 2022-09-05 MED ORDER — HYDROMORPHONE HCL 1 MG/ML IJ SOLN
0.5000 mg | INTRAMUSCULAR | Status: DC | PRN
  Administered 2022-09-05 (×2): 1 mg via INTRAVENOUS
  Filled 2022-09-05 (×2): qty 1

## 2022-09-05 NOTE — Consult Note (Signed)
Reason for Consult:cholecystitis Referring Physician: Dr Frank Gilmore is an 72 y.o. male.  HPI: This is a 72 year old gentleman with a history of kidney stones who reports that he started having discomfort earlier this week in Frank left side of his abdomen.  He reports passing at least 4 kidney stones over Frank past several days.  Late Thursday evening he started having right-sided abdominal pain and it got severe enough sharp enough to present to Frank emergency department at [facility].  Unfortunately their CT scanner was down so he was transferred here for further evaluation.  He has no known previous history of biliary colic.  He underwent a noncontrasted CT scan which does show hydronephrosis on Frank left side with a stone in Frank distal ureter.  He also has an extensive amount of inflammation around his gallbladder and secondary inflammation suspected of Frank hepatic flexure of Frank colon.  He reports his last colonoscopy was approximately 10 years ago.  His ultrasound shows gallbladder wall is 6 mm in size but there is no gallstones.  He is otherwise without complaints.  He denies recent weight loss, weight gain, jaundice, fevers, or chills.  He has noticed no blood in his urine or in his bowel movements  Past Medical History:  Diagnosis Date   Hyperlipidemia    Hypertension    Kidney stone     Past Surgical History:  Procedure Laterality Date   LITHOTRIPSY      History reviewed. No pertinent family history.  Social History:  reports that he has quit smoking. His smoking use included cigarettes. He has never used smokeless tobacco. He reports that he does not drink alcohol and does not use drugs.  Allergies: No Known Allergies  Medications: I have reviewed Frank Gilmore's current medications.  Results for orders placed or performed during Frank hospital encounter of 09/05/22 (from Frank past 48 hour(s))  Urinalysis, Routine w reflex microscopic -Urine, Clean Catch     Status: Abnormal    Collection Time: 09/05/22  6:01 AM  Result Value Ref Range   Color, Urine YELLOW YELLOW   APPearance CLEAR CLEAR   Specific Gravity, Urine 1.020 1.005 - 1.030   pH 5.0 5.0 - 8.0   Glucose, UA NEGATIVE NEGATIVE mg/dL   Hgb urine dipstick SMALL (A) NEGATIVE   Bilirubin Urine NEGATIVE NEGATIVE   Ketones, ur 5 (A) NEGATIVE mg/dL   Protein, ur 30 (A) NEGATIVE mg/dL   Nitrite NEGATIVE NEGATIVE   Leukocytes,Ua NEGATIVE NEGATIVE   RBC / HPF 11-20 0 - 5 RBC/hpf   WBC, UA 0-5 0 - 5 WBC/hpf   Bacteria, UA NONE SEEN NONE SEEN   Squamous Epithelial / HPF 0-5 0 - 5 /HPF   Mucus PRESENT     Comment: Performed at [facility], [address] [place], [place] [address]  Comprehensive metabolic panel     Status: Abnormal   Collection Time: 09/05/22  6:01 AM  Result Value Ref Range   Sodium 137 135 - 145 mmol/L   Potassium 4.0 3.5 - 5.1 mmol/L   Chloride 105 98 - 111 mmol/L   CO2 22 22 - 32 mmol/L   Glucose, Bld 135 (H) 70 - 99 mg/dL    Comment: Glucose reference range applies only to samples taken after fasting for at least 8 hours.   BUN 27 (H) 8 - 23 mg/dL   Creatinine, Ser 6.21 (H) 0.61 - 1.24 mg/dL   Calcium 9.0 8.9 - 30.8 mg/dL   Total  Protein 7.3 6.5 - 8.1 g/dL   Albumin 3.8 3.5 - 5.0 g/dL   AST 28 15 - 41 U/L   ALT 27 0 - 44 U/L   Alkaline Phosphatase 54 38 - 126 U/L   Total Bilirubin 1.0 0.3 - 1.2 mg/dL   GFR, Estimated 38 (L) >60 mL/min    Comment: (NOTE) Calculated using Frank CKD-EPI Creatinine Equation (2021)    Anion gap 10 5 - 15    Comment: Performed at [facility], [address], [place], [place] [address]  CBC with Differential     Status: Abnormal   Collection Time: 09/05/22  6:01 AM  Result Value Ref Range   WBC 15.3 (H) 4.0 - 10.5 K/uL   RBC 4.25 4.22 - 5.81 MIL/uL   Hemoglobin 13.3 13.0 - 17.0 g/dL   HCT 13.0 86.5 - 78.4 %   MCV 94.4 80.0 - 100.0 fL   MCH 31.3 26.0 - 34.0 pg   MCHC 33.2 30.0 - 36.0 g/dL   RDW 69.6 29.5 - 28.4 %   Platelets 160 150 - 400 K/uL    nRBC 0.0 0.0 - 0.2 %   Neutrophils Relative % 76 %   Neutro Abs 11.6 (H) 1.7 - 7.7 K/uL   Lymphocytes Relative 10 %   Lymphs Abs 1.6 0.7 - 4.0 K/uL   Monocytes Relative 11 %   Monocytes Absolute 1.8 (H) 0.1 - 1.0 K/uL   Eosinophils Relative 2 %   Eosinophils Absolute 0.3 0.0 - 0.5 K/uL   Basophils Relative 0 %   Basophils Absolute 0.1 0.0 - 0.1 K/uL   Immature Granulocytes 1 %   Abs Immature Granulocytes 0.08 (H) 0.00 - 0.07 K/uL    Comment: Performed at [facility], [address] [place], [place] [address]  Lipase, blood     Status: None   Collection Time: 09/05/22  6:01 AM  Result Value Ref Range   Lipase 31 11 - 51 U/L    Comment: Performed at [facility], [address] [place], [place] [address]    US Abdomen Limited RUQ (LIVER/GB)  Result Date: 09/05/2022 CLINICAL DATA:  Abdominal pain for 3 days. EXAM: ULTRASOUND ABDOMEN LIMITED RIGHT UPPER QUADRANT COMPARISON:  CT AP from earlier today FINDINGS: Gallbladder: Gallbladder appears distended and is filled with sludge. Gallbladder wall thickness measures up to 6 mm. No stones identified. A negative sonographic Murphy's sign was reported by Frank sonographer. Common bile duct: Diameter: 5.0 mm Liver: Increased parenchymal echogenicity. No focal liver abnormality. Portal vein is patent on color Doppler imaging with normal direction of blood flow towards Frank liver. Other: Exam detail is diminished secondary to overlying bowel gas and Gilmore's body habitus. IMPRESSION: 1. Diminished exam detail due to body habitus and overlying bowel gas. 2. Frank gallbladder is distended and filled with sludge. There is gallbladder wall thickening measuring up to 6 mm. No stones identified. Negative sonographic Murphy's sign. 3. Despite absence of gallstones and positive sonographic Murphy's sign there is considerable inflammatory change noted within Frank right upper quadrant surrounding hepatic flexure and gallbladder on Frank CT from earlier today. Those  findings remain suspicious for either acute cholecystitis or segmental colitis with secondary inflammation of Frank gallbladder. 4. Increased echogenicity of Frank liver parenchyma compatible with hepatic steatosis. Electronically Signed   By: Signa Kell M.D.   On: 09/05/2022 11:16   CT Renal Stone Study  Result Date: 09/05/2022 CLINICAL DATA:  Abdominal/flank pain.  Evaluate for kidney stone. EXAM: CT ABDOMEN AND  PELVIS WITHOUT CONTRAST TECHNIQUE: Multidetector CT imaging of Frank abdomen and pelvis was performed following Frank standard protocol without IV contrast. RADIATION DOSE REDUCTION: This exam was performed according to Frank departmental dose-optimization program which includes automated exposure control, adjustment of the mA and/or kV according to Gilmore size and/or use of iterative reconstruction technique. COMPARISON:  08/21/2018 FINDINGS: Lower chest: Bilateral pleural thickening and lower lobe subsegmental atelectasis is identified. No pleural fluid or airspace consolidation. Hepatobiliary: No suspicious liver lesion identified. There are inflammatory changes surrounding Frank gallbladder with possible gallbladder wall thickening (difficult to assess reflecting lack of IV contrast). Focal area of low density adjacent to Frank gallbladder fossa within segment 4 B is identified and is favored to represent focal fatty deposition, image 23/2. Fat stranding in is also noted extending around Frank hepatic flexure which exhibits bowel wall edema. No calcified gallstones identified. No signs of bile duct dilatation. Pancreas: Unremarkable. No pancreatic ductal dilatation or surrounding inflammatory changes. Spleen: Normal in size without focal abnormality. Adrenals/Urinary Tract: Normal adrenal glands. Bilateral nephrolithiasis. Frank largest stone is identified within Frank right renal pelvis measuring 1.3 by 0.7 cm, image 48/2. There is left hydronephrosis and hydroureter. Stone within Frank distal ureter measures 5  mm, image 83/2 no right-sided hydronephrosis or hydroureter. Bladder is normal. Stomach/Bowel: Stomach is within normal limits. Frank appendix is visualized and is normal. Short segment of wall thickening with surrounding inflammation involves Frank hepatic flexure adjacent to Frank gallbladder. Vascular/Lymphatic: Aortic atherosclerosis. No abdominopelvic adenopathy. Reproductive: Prostate is unremarkable. Other: There is a small amount of fluid along Frank peritoneal reflection at Frank level of Frank left iliac fossa, image 78/2. No focal fluid collections identified to suggest an abscess. Musculoskeletal: No acute or significant osseous findings. No acute or suspicious osseous findings. IMPRESSION: 1. Bilateral nephrolithiasis. Left-sided hydronephrosis and hydroureter secondary to 5 mm distal left ureteral calculus. 2. There are inflammatory changes surrounding Frank gallbladder. No calcified gallstones identified. If there is a clinical concern for acute cholecystitis consider further evaluation with right upper quadrant ultrasound. 3. Short segment of wall thickening with surrounding inflammation involves Frank hepatic flexure adjacent to Frank gallbladder. This is favored to represent secondary inflammation of Frank colon. Less favored would be acute diverticulitis or short segment colitis with secondary inflammation of Frank gallbladder. 4.  Aortic Atherosclerosis (ICD10-I70.0). Electronically Signed   By: Signa Kell M.D.   On: 09/05/2022 09:33    Review of Systems  All other systems reviewed and are negative.  Blood pressure (!) 159/107, pulse 83, temperature 98.2 F (36.8 C), temperature source Oral, resp. rate 17, height 5\' 8"  (1.727 m), weight 99.8 kg, SpO2 96 %. Physical Exam Constitutional:      General: He is not in acute distress.    Appearance: Normal appearance. He is not ill-appearing.  HENT:     Head: Normocephalic and atraumatic.     Right Ear: External ear normal.     Left Ear: External ear  normal.     Nose: Nose normal.  Eyes:     General: No scleral icterus.    Pupils: Pupils are equal, round, and reactive to light.  Cardiovascular:     Rate and Rhythm: Normal rate and regular rhythm.  Pulmonary:     Effort: Pulmonary effort is normal. No respiratory distress.     Breath sounds: Normal breath sounds.  Abdominal:     Comments: Abdomen is obese.  There is tenderness with guarding in Frank right upper quadrant  Musculoskeletal:  Cervical back: Normal range of motion and neck supple.  Skin:    General: Skin is warm and dry.  Neurological:     General: No focal deficit present.     Mental Status: He is alert.  Psychiatric:        Behavior: Behavior normal.        Judgment: Judgment normal.     Assessment/Plan: Cholecystitis Kidney stones with left ureteral stone causing hydronephrosis  He does have a significant mount of inflammation around Frank gallbladder and this may be involving Frank hepatic flexure of Frank colon as well.  I am going to order an MRI of his liver to evaluate Frank gallbladder to make sure we are not dealing with a malignancy and to better define Frank anatomy prior to cholecystectomy versus a percutaneous cholecystostomy tube.  I would recommend a urology consult regarding Frank ureteral stone and hydronephrosis.  As he has passed multiple stones, he may pass this well without Frank need for urology to intervene.  I recommend IV antibiotics and bowel rest as well. His creatinine is up and his GFR is abnormal  I explained this to he and his wife who understand and agree with Frank current plans.  Complex medical decision making  Abigail Miyamoto MD 09/05/2022, 1:57 PM

## 2022-09-05 NOTE — ED Provider Notes (Signed)
Patient transferred by POV from [facility] for renal CT imaging.  Patient accepted from Dr. Judd Lien with concern of kidney stone and possible AKI.  Patient does have history of kidney stones.  On blood work, he has leukocytosis and elevated Cr with reduced GFR.   Patient has not had anything to eat or drink today and his last meal was yesterday evening.  He normally takes 81 mg ASA daily, but has not had this in 2 days.  He also has not taken his regular BP meds for 2 days as well due to feeling poorly.   Patient reports that he did have a fever last night and he took ibuprofen.  He has been afebrile today.    Physical Exam  BP 135/81   Pulse 84   Temp 98.2 F (36.8 C) (Oral)   Resp 15   Ht 5\' 8"  (1.727 m)   Wt 99.8 kg   SpO2 97%   BMI 33.45 kg/m   Physical Exam Vitals and nursing note reviewed.  Constitutional:      General: He is not in acute distress.    Appearance: Normal appearance. He is not ill-appearing or diaphoretic.  Cardiovascular:     Rate and Rhythm: Normal rate and regular rhythm.  Pulmonary:     Effort: Pulmonary effort is normal.  Abdominal:     General: Abdomen is flat. Bowel sounds are normal.     Palpations: Abdomen is soft.     Tenderness: There is abdominal tenderness in the right upper quadrant. There is right CVA tenderness and guarding (RUQ). There is no rebound. Negative signs include Murphy's sign and McBurney's sign.     Hernia: No hernia is present.  Skin:    General: Skin is warm and dry.     Capillary Refill: Capillary refill takes less than 2 seconds.     Coloration: Skin is not jaundiced.  Neurological:     Mental Status: He is alert. Mental status is at baseline.  Psychiatric:        Mood and Affect: Mood normal.        Behavior: Behavior normal.     Procedures  Procedures  ED Course / MDM    Medical Decision Making Amount and/or Complexity of Data Reviewed Labs: ordered. Radiology: ordered.  Risk Prescription drug  management. Decision regarding hospitalization.   This patient presents to the ED with chief complaint(s) of RUQ abdominal pain, R flank pain with pertinent past medical history of kidney stone.  The complaint involves an extensive differential diagnosis and also carries with it a high risk of complications and morbidity.    The differential diagnosis includes kidney stone, pyelonephritis, cholecystitis, colitis, diverticulitis  Initial Assessment:   Exam significant for RUQ abdominal tenderness with guarding and R CVA tenderness.  No RLQ tenderness.  Abdomen otherwise soft, non distended, no appreciable hernias and no overlying skin changes.  Patient does not appear to be in acute distress.  Heart rate is normal in the 80s with regular rhythm.  Pulmonary effort normal.    Independent interpretation of imaging: I ordered and personally interpreted CT renal stone study which showed hydronephrosis on the left with left sided ureterolithiasis.  Bilateral nephrolithiasis with large non-obstructing stone in right renal pelvis.  Stranding around gallbladder suggestive of inflammation.  No obvious gallstones.  I agree with radiologist interpretation.    [place] RUQ was ordered to further investigate gallbladder. Gallbladder distended and filled with sludge.  Gallbladder wall thickening present up to  6 mm.  No obvious stones.  There are sonographic findings concerning for inflammatory changes.   Cholecystitis vs colitis based on imaging.   Treatment and Reassessment: Patient given IV fluids, Zofran, and Dilaudid with improvement in his symptoms.  He does report his pain returning after US performed, will re dose pain medication.  Patient otherwise resting comfortably.   Discussed results with patient and concern for cholecystitis.  Will consult with general surgery.    Consultations obtained: I requested consultation with on-call general surgery provider and spoke with Carman Ching who recommended  medical admission due to low GFR/elevated Cr and he will come see patient.    I requested consultation with on-call hospitalist and spoke with Dr. Latrelle Dodrill who agreed with hospital admission.  Disposition:   Patient be admitted to [facility] for suspected cholecystitis with general surgery following.          Lenard Simmer, PA-C 09/05/22 1444    Pricilla Loveless, MD 09/07/22 2306

## 2022-09-05 NOTE — ED Provider Notes (Signed)
[place] EMERGENCY DEPARTMENT AT [facility] Provider Note   CSN: [id number] Arrival date & time: 09/05/22  2130     History  Chief Complaint  Patient presents with   Flank Pain    Frank Gilmore is a 72 y.o. male.  Patient is a 72 year old male with history of prior kidney stones, hypertension.  Patient presenting today with complaints of right-sided flank and abdominal pain.  This started yesterday evening in the absence of any injury or trauma.  Pain feels similar to what he experienced with prior kidney stones.  He reports earlier in the week experiencing left-sided flank pain and passed four small stones.  He does report some hematuria along with the stone passage.  He denies any fevers or chills.  He denies any bowel or bladder complaints.    The history is provided by the patient.       Home Medications Prior to Admission medications   Medication Sig Start Date End Date Taking? Authorizing Provider  amoxicillin (AMOXIL) 500 MG capsule Take 2 capsules (1,000 mg total) by mouth 2 (two) times daily. 06/19/17   Dione Booze, MD  aspirin EC 81 MG tablet Take 81 mg by mouth daily.      [provider]  hydrochlorothiazide (HYDRODIURIL) 12.5 MG tablet Take 12.5 mg by mouth daily. 07/01/18   [provider]  losartan (COZAAR) 100 MG tablet Take 100 mg by mouth daily.      [provider]  Multiple Vitamins-Minerals (MULTIVITAMINS THER. W/MINERALS) TABS Take 1 tablet by mouth daily.      [provider]  naproxen sodium (ANAPROX) 220 MG tablet Take 220 mg by mouth every 8 (eight) hours as needed. For pain     [provider]  oxyCODONE-acetaminophen (PERCOCET/ROXICET) 5-325 MG tablet Take 1 tablet by mouth every 4 (four) hours as needed for severe pain. 09/10/18   Triplett, Tammy, PA-C  oxyCODONE-acetaminophen (PERCOCET/ROXICET) 5-325 MG tablet Take 1 tablet by mouth every 4 (four) hours as needed. 09/10/18   Triplett, Tammy, PA-C   pravastatin (PRAVACHOL) 20 MG tablet Take 20 mg by mouth daily. 07/13/18   [provider]      Allergies    Patient has no known allergies.    Review of Systems   Review of Systems  All other systems reviewed and are negative.   Physical Exam Updated Vital Signs BP (!) 151/94 (BP Location: Left Arm)   Pulse 76   Temp 97.7 F (36.5 C) (Oral)   Resp 18   Ht 5\' 8"  (1.727 m)   Wt 99.8 kg   SpO2 98%   BMI 33.45 kg/m  Physical Exam Vitals and nursing note reviewed.  Constitutional:      General: He is not in acute distress.    Appearance: He is well-developed. He is not diaphoretic.  HENT:     Head: Normocephalic and atraumatic.  Cardiovascular:     Rate and Rhythm: Normal rate and regular rhythm.     Heart sounds: No murmur heard.    No friction rub.  Pulmonary:     Effort: Pulmonary effort is normal. No respiratory distress.     Breath sounds: Normal breath sounds. No wheezing or rales.  Abdominal:     General: Bowel sounds are normal. There is no distension.     Palpations: Abdomen is soft.     Tenderness: There is abdominal tenderness. There is right CVA tenderness. There is no left CVA tenderness, guarding or rebound.  Comments: There is right upper quadrant tenderness with no rebound or guarding.  There is also mild right-sided CVA tenderness.  Musculoskeletal:        General: Normal range of motion.     Cervical back: Normal range of motion and neck supple.  Skin:    General: Skin is warm and dry.  Neurological:     Mental Status: He is alert and oriented to person, place, and time.     Coordination: Coordination normal.     ED Results / Procedures / Treatments   Labs (all labs ordered are listed, but only abnormal results are displayed) Labs Reviewed  CBC WITH DIFFERENTIAL/PLATELET - Abnormal; Notable for the following components:      Result Value   WBC 15.3 (*)    Neutro Abs 11.6 (*)    Monocytes Absolute 1.8 (*)    Abs Immature  Granulocytes 0.08 (*)    All other components within normal limits  URINALYSIS, ROUTINE W REFLEX MICROSCOPIC  COMPREHENSIVE METABOLIC PANEL  LIPASE, BLOOD    EKG None  Radiology No results found.  Procedures Procedures    Medications Ordered in ED Medications  ondansetron (ZOFRAN) injection 4 mg (4 mg Intravenous Given 09/05/22 0617)  morphine (PF) 4 MG/ML injection 4 mg (4 mg Intravenous Given 09/05/22 1914)    ED Course/ Medical Decision Making/ A&P  Patient is a 72 year old male with past medical history of renal calculi.  Patient presenting today with complaints of left flank pain earlier this week and passing several stones, now with right-sided upper quadrant pain and right flank pain.  Patient arrives here with stable vital signs.  He is nontoxic and clinically well-appearing.  He does have some tenderness to the right upper quadrant and right lateral abdomen, but no peritoneal signs.  Workup initiated including CBC, CMP, and lipase.  He has a leukocytosis with white count of 15.3 and slight elevation of his creatinine above baseline at 1.87, but laboratory studies are otherwise unremarkable.  Urinalysis does reveal microscopic hematuria but no evidence for infection.  I feel as though patient will require imaging to evaluate both his gallbladder and renal system.  I suspect kidney stone, but also have some concern about his gallbladder.  There is no CT scanner this evening at [facility] due to equipment malfunction.  Patient will be sent to [facility] to undergo renal CT and further evaluation as indicated.  Final Clinical Impression(s) / ED Diagnoses Final diagnoses:  None    Rx / DC Orders ED Discharge Orders     None         Geoffery Lyons, MD 09/05/22 [phone]

## 2022-09-05 NOTE — H&P (Addendum)
History and Physical    Patient: Frank Gilmore:[id number] DOB: Jul 04, 1950 DOA: 09/05/2022 DOS: the patient was seen and examined on 09/05/2022 PCP: Benita Stabile, MD  Patient coming from: Home  Chief Complaint:  Chief Complaint  Patient presents with   Flank Pain   HPI: Frank Gilmore is a 72 y.o. male with medical history significant of prior kidney stones, hypertension and hyperlipidemia presented to [facility] emergency department complaining of right-sided flank pain and right upper quadrant pain.  Patient reports passing 4 kidney stones in the past 2 to 3 days, then he began to feel better however 2 days ago started having discomfort on the right upper quadrant which was new to him.  He reports having a hamburger yesterday and after that symptoms worsen.  He was transferred from [facility] to [facility] due to no CT scan availability. He denies weight loss, fevers, chills, jaundice, diarrhea and constipation.  ER evaluation found, CT scan with bilateral nephrolithiasis, left-sided hydronephrosis and hydroureter secondary to 5 mm distal left ureteral calculus.  There was also inflammatory changes at the gallbladder, right upper quadrant ultrasound showed significant inflammation of the gallbladder consistent with cholecystitis.  There is also bowel segment with wall thickening around the hepatic flexure, suspect to be secondary to gallbladder inflammation.  General surgery was consulted recommend medical admission and consult with urology.  MRI of the abdomen was ordered.  Review of Systems: As mentioned in the history of present illness. All other systems reviewed and are negative.  Past Medical History:  Diagnosis Date   Hyperlipidemia    Hypertension    Kidney stone    Past Surgical History:  Procedure Laterality Date   LITHOTRIPSY     Social History:  reports that he has quit smoking. His smoking use included cigarettes. He has never used smokeless tobacco. He reports that  he does not drink alcohol and does not use drugs.  No Known Allergies  History reviewed. No pertinent family history.  Prior to Admission medications   Medication Sig Start Date End Date Taking? Authorizing Provider  aspirin EC 81 MG tablet Take 81 mg by mouth daily.     Yes [provider]  hydrochlorothiazide (HYDRODIURIL) 12.5 MG tablet Take 12.5 mg by mouth daily. 07/01/18  Yes [provider]  losartan (COZAAR) 100 MG tablet Take 100 mg by mouth daily.     Yes [provider]  Multiple Vitamins-Minerals (MULTIVITAMINS THER. W/MINERALS) TABS Take 1 tablet by mouth daily.     Yes [provider]  naproxen sodium (ANAPROX) 220 MG tablet Take 220 mg by mouth every 8 (eight) hours as needed. For pain    Yes [provider]  pravastatin (PRAVACHOL) 40 MG tablet Take 1 tablet by mouth daily. 08/27/22  Yes [provider]  amoxicillin (AMOXIL) 500 MG capsule Take 2 capsules (1,000 mg total) by mouth 2 (two) times daily. Patient not taking: Reported on 09/05/2022 06/19/17   Dione Booze, MD  oxyCODONE-acetaminophen (PERCOCET/ROXICET) 5-325 MG tablet Take 1 tablet by mouth every 4 (four) hours as needed for severe pain. Patient not taking: Reported on 09/05/2022 09/10/18   Pauline Aus, PA-C  oxyCODONE-acetaminophen (PERCOCET/ROXICET) 5-325 MG tablet Take 1 tablet by mouth every 4 (four) hours as needed. Patient not taking: Reported on 09/05/2022 09/10/18   Pauline Aus, PA-C    Physical Exam: Vitals:   09/05/22 0802 09/05/22 1100 09/05/22 1153 09/05/22 1400  BP: (!) 152/86 (!) 159/107  135/81  Pulse:  68 83  84  Resp: 16 17  15   Temp: 98.1 F (36.7 C)  98.2 F (36.8 C)   TempSrc: Oral  Oral   SpO2: 99% 96%  97%  Weight:      Height:        Constitutional: NAD, calm, comfortable Eyes: PERRL, lids and conjunctivae normal ENMT: Mucous membranes are moist. Posterior pharynx clear of any exudate or lesions.Normal dentition.  Neck:  normal, supple, no masses, no thyromegaly Respiratory: clear to auscultation bilaterally, no wheezing, no crackles. Normal respiratory effort. No accessory muscle use.  Cardiovascular: Regular rate and rhythm, no murmurs / rubs / gallops. No extremity edema. 2+ pedal pulses. No carotid bruits.  Abdomen: no tenderness, no masses palpated. No hepatosplenomegaly. Bowel sounds positive.  Musculoskeletal: no clubbing / cyanosis. No joint deformity upper and lower extremities. Good ROM, no contractures. Normal muscle tone.  Skin: no rashes, lesions, ulcers. No induration Neurologic: CN 2-12 grossly intact. Sensation intact, DTR normal. Strength 5/5 in all 4.  Psychiatric: Normal judgment and insight. Alert and oriented x 3. Normal mood.   Data Reviewed:  There are no new results to review at this time.  Assessment and Plan: Service: Hospitalist  Cholecystitis  Admit to MedSurg, general surgery consulted, order MRI of the abdomen, recommended n.p.o. for now.  Possibly cholecystectomy versus percutaneous cholecystectomy tube.  Further recommendations per general surgery.  Nephrolithiasis with left hydronephrosis  5 mm distal left ureteral calculus, with some decrease on GFR and increasing creatinine.  Unclear what is his baseline as his last GFR was 3 years ago.  Urology consulted, recommended IV fluids and tamsulosin, they will evaluate.  HTN   Chronic, blood pressure stable.  Holding home meds due to NPO.  Hydralazine as needed.  ?AKI Unknown baseline, IV fluids.  Daily renal function   Advance Care Planning:   Code Status: Full Code   Consults: General surgery and urologist  Family Communication: Wife at bedside  Severity of Illness: The appropriate patient status for this patient is INPATIENT. Inpatient status is judged to be reasonable and necessary in order to provide the required intensity of service to ensure the patient's safety. The patient's presenting symptoms, physical exam  findings, and initial radiographic and laboratory data in the context of their chronic comorbidities is felt to place them at high risk for further clinical deterioration. Furthermore, it is not anticipated that the patient will be medically stable for discharge from the hospital within 2 midnights of admission.   I certify that at the point of admission it is my clinical judgment that the patient will require inpatient hospital care spanning beyond 2 midnights from the point of admission due to high intensity of service, high risk for further deterioration and high frequency of surveillance required.  Author: Latrelle Dodrill, MD 09/05/2022 3:28 PM  For on call review [email or web address].

## 2022-09-05 NOTE — ED Notes (Signed)
Pt arrived POV as a transfer from [facility]. CAT scan machine is down. Pt is complaining of pain rated at a 6. Pt has passed 4 kidney stones this week and is still having no relief.

## 2022-09-05 NOTE — ED Triage Notes (Signed)
Pt states he passed 4 kidney stones this past week but still c/o Rt sided flank pain.

## 2022-09-05 NOTE — ED Notes (Signed)
ED Provider at bedside. 

## 2022-09-05 NOTE — Consult Note (Signed)
Urology Consult   Physician requesting consult: Sharion Dove  Reason for consult: Left ureteral calculus  History of Present Illness: Frank Gilmore is a 72 y.o. white male who send history of prior nephrolithiasis.  Had had some pain earlier in the week and passed 2-3 small stones from what appeared to be the left side by his report.  He had subsequent worsening of right-sided upper quadrant pain 2 days ago and this worsened to the degree where he presented to the [facility] emergency room for further evaluation and management.  The CT scanner and antipronation was down and therefore he was transferred down to [facility] for further evaluation and management.  CT urogram was performed and showed a 5 mm distal left ureteral calculus with mild to moderate hydronephrosis.  There is also 1 cm x 0.7 cm nonobstructing right renal calculus and some smaller right renal calculi.  There are no right ureteral stones.  Patient also had what appeared to be inflammatory process in and around the gallbladder.  Patient currently having right-sided pain but no left-sided pain.  Patient has had lithotripsy in the past for treatment of his stones and never required any endoscopic manipulation.  He denies a history of voiding or storage urinary symptoms, hematuria, UTIs, STDs, urolithiasis, GU malignancy/trauma/surgery.  Past Medical History:  Diagnosis Date   Hyperlipidemia    Hypertension    Kidney stone     Past Surgical History:  Procedure Laterality Date   LITHOTRIPSY      Current Hospital Medications:  Home Meds:  No current facility-administered medications on file prior to encounter.   Current Outpatient Medications on File Prior to Encounter  Medication Sig Dispense Refill   aspirin EC 81 MG tablet Take 81 mg by mouth daily.       hydrochlorothiazide (HYDRODIURIL) 12.5 MG tablet Take 12.5 mg by mouth daily.     losartan (COZAAR) 100 MG tablet Take 100 mg by mouth daily.       Multiple  Vitamins-Minerals (MULTIVITAMINS THER. W/MINERALS) TABS Take 1 tablet by mouth daily.       naproxen sodium (ANAPROX) 220 MG tablet Take 220 mg by mouth every 8 (eight) hours as needed. For pain      pravastatin (PRAVACHOL) 40 MG tablet Take 1 tablet by mouth daily.       Scheduled Meds:  enoxaparin (LOVENOX) injection  40 mg Subcutaneous Q24H   tamsulosin  0.4 mg Oral Daily   Continuous Infusions:  sodium chloride     PRN Meds:.HYDROmorphone (DILAUDID) injection, ondansetron (ZOFRAN) IV  Allergies: No Known Allergies  History reviewed. No pertinent family history.  Social History:  reports that he has quit smoking. His smoking use included cigarettes. He has never used smokeless tobacco. He reports that he does not drink alcohol and does not use drugs.  ROS: A complete review of systems was performed.  All systems are negative except for pertinent findings as noted.  Physical Exam:  Vital signs in last 24 hours: Temp:  [97.7 F (36.5 C)-101.2 F (38.4 C)] 101.2 F (38.4 C) (06/22 1700) Pulse Rate:  [68-93] 93 (06/22 1700) Resp:  [15-18] 16 (06/22 1700) BP: (135-159)/(70-107) 137/70 (06/22 1700) SpO2:  [91 %-99 %] 91 % (06/22 1700) Weight:  [99.8 kg] 99.8 kg (06/22 0559) Constitutional:  Alert and oriented, No acute distress Cardiovascular: Regular rate and rhythm, No JVD Respiratory: Normal respiratory effort, Lungs clear bilaterally GI: Abdomen is soft there is guarding in the right upper quadrant.  No  rebound tenderness.  He has no tenderness on the left lower quadrant or CVA tenderness GU: No CVA tenderness Lymphatic: No lymphadenopathy Neurologic: Grossly intact, no focal deficits Psychiatric: Normal mood and affect  Laboratory Data:  Recent Labs    09/05/22 0601  WBC 15.3*  HGB 13.3  HCT 40.1  PLT 160    Recent Labs    09/05/22 0601  NA 137  K 4.0  CL 105  GLUCOSE 135*  BUN 27*  CALCIUM 9.0  CREATININE 1.87*     Results for orders placed or  performed during the hospital encounter of 09/05/22 (from the past 24 hour(s))  Urinalysis, Routine w reflex microscopic -Urine, Clean Catch     Status: Abnormal   Collection Time: 09/05/22  6:01 AM  Result Value Ref Range   Color, Urine YELLOW YELLOW   APPearance CLEAR CLEAR   Specific Gravity, Urine 1.020 1.005 - 1.030   pH 5.0 5.0 - 8.0   Glucose, UA NEGATIVE NEGATIVE mg/dL   Hgb urine dipstick SMALL (A) NEGATIVE   Bilirubin Urine NEGATIVE NEGATIVE   Ketones, ur 5 (A) NEGATIVE mg/dL   Protein, ur 30 (A) NEGATIVE mg/dL   Nitrite NEGATIVE NEGATIVE   Leukocytes,Ua NEGATIVE NEGATIVE   RBC / HPF 11-20 0 - 5 RBC/hpf   WBC, UA 0-5 0 - 5 WBC/hpf   Bacteria, UA NONE SEEN NONE SEEN   Squamous Epithelial / HPF 0-5 0 - 5 /HPF   Mucus PRESENT   Comprehensive metabolic panel     Status: Abnormal   Collection Time: 09/05/22  6:01 AM  Result Value Ref Range   Sodium 137 135 - 145 mmol/L   Potassium 4.0 3.5 - 5.1 mmol/L   Chloride 105 98 - 111 mmol/L   CO2 22 22 - 32 mmol/L   Glucose, Bld 135 (H) 70 - 99 mg/dL   BUN 27 (H) 8 - 23 mg/dL   Creatinine, Ser 1.61 (H) 0.61 - 1.24 mg/dL   Calcium 9.0 8.9 - 09.6 mg/dL   Total Protein 7.3 6.5 - 8.1 g/dL   Albumin 3.8 3.5 - 5.0 g/dL   AST 28 15 - 41 U/L   ALT 27 0 - 44 U/L   Alkaline Phosphatase 54 38 - 126 U/L   Total Bilirubin 1.0 0.3 - 1.2 mg/dL   GFR, Estimated 38 (L) >60 mL/min   Anion gap 10 5 - 15  CBC with Differential     Status: Abnormal   Collection Time: 09/05/22  6:01 AM  Result Value Ref Range   WBC 15.3 (H) 4.0 - 10.5 K/uL   RBC 4.25 4.22 - 5.81 MIL/uL   Hemoglobin 13.3 13.0 - 17.0 g/dL   HCT 04.5 40.9 - 81.1 %   MCV 94.4 80.0 - 100.0 fL   MCH 31.3 26.0 - 34.0 pg   MCHC 33.2 30.0 - 36.0 g/dL   RDW 91.4 78.2 - 95.6 %   Platelets 160 150 - 400 K/uL   nRBC 0.0 0.0 - 0.2 %   Neutrophils Relative % 76 %   Neutro Abs 11.6 (H) 1.7 - 7.7 K/uL   Lymphocytes Relative 10 %   Lymphs Abs 1.6 0.7 - 4.0 K/uL   Monocytes Relative 11 %    Monocytes Absolute 1.8 (H) 0.1 - 1.0 K/uL   Eosinophils Relative 2 %   Eosinophils Absolute 0.3 0.0 - 0.5 K/uL   Basophils Relative 0 %   Basophils Absolute 0.1 0.0 - 0.1 K/uL   Immature Granulocytes  1 %   Abs Immature Granulocytes 0.08 (H) 0.00 - 0.07 K/uL  Lipase, blood     Status: None   Collection Time: 09/05/22  6:01 AM  Result Value Ref Range   Lipase 31 11 - 51 U/L   No results found for this or any previous visit (from the past 240 hour(s)).  Renal Function: Recent Labs    09/05/22 0601  CREATININE 1.87*   Estimated Creatinine Clearance: 41.5 mL/min (A) (by C-G formula based on SCr of 1.87 mg/dL (H)).  Radiologic Imaging: US Abdomen Limited RUQ (LIVER/GB)  Result Date: 09/05/2022 CLINICAL DATA:  Abdominal pain for 3 days. EXAM: ULTRASOUND ABDOMEN LIMITED RIGHT UPPER QUADRANT COMPARISON:  CT AP from earlier today FINDINGS: Gallbladder: Gallbladder appears distended and is filled with sludge. Gallbladder wall thickness measures up to 6 mm. No stones identified. A negative sonographic Murphy's sign was reported by the sonographer. Common bile duct: Diameter: 5.0 mm Liver: Increased parenchymal echogenicity. No focal liver abnormality. Portal vein is patent on color Doppler imaging with normal direction of blood flow towards the liver. Other: Exam detail is diminished secondary to overlying bowel gas and patient's body habitus. IMPRESSION: 1. Diminished exam detail due to body habitus and overlying bowel gas. 2. The gallbladder is distended and filled with sludge. There is gallbladder wall thickening measuring up to 6 mm. No stones identified. Negative sonographic Murphy's sign. 3. Despite absence of gallstones and positive sonographic Murphy's sign there is considerable inflammatory change noted within the right upper quadrant surrounding hepatic flexure and gallbladder on the CT from earlier today. Those findings remain suspicious for either acute cholecystitis or segmental colitis  with secondary inflammation of the gallbladder. 4. Increased echogenicity of the liver parenchyma compatible with hepatic steatosis. Electronically Signed   By: Signa Kell M.D.   On: 09/05/2022 11:16   CT Renal Stone Study  Result Date: 09/05/2022 CLINICAL DATA:  Abdominal/flank pain.  Evaluate for kidney stone. EXAM: CT ABDOMEN AND PELVIS WITHOUT CONTRAST TECHNIQUE: Multidetector CT imaging of the abdomen and pelvis was performed following the standard protocol without IV contrast. RADIATION DOSE REDUCTION: This exam was performed according to the departmental dose-optimization program which includes automated exposure control, adjustment of the mA and/or kV according to patient size and/or use of iterative reconstruction technique. COMPARISON:  08/21/2018 FINDINGS: Lower chest: Bilateral pleural thickening and lower lobe subsegmental atelectasis is identified. No pleural fluid or airspace consolidation. Hepatobiliary: No suspicious liver lesion identified. There are inflammatory changes surrounding the gallbladder with possible gallbladder wall thickening (difficult to assess reflecting lack of IV contrast). Focal area of low density adjacent to the gallbladder fossa within segment 4 B is identified and is favored to represent focal fatty deposition, image 23/2. Fat stranding in is also noted extending around the hepatic flexure which exhibits bowel wall edema. No calcified gallstones identified. No signs of bile duct dilatation. Pancreas: Unremarkable. No pancreatic ductal dilatation or surrounding inflammatory changes. Spleen: Normal in size without focal abnormality. Adrenals/Urinary Tract: Normal adrenal glands. Bilateral nephrolithiasis. The largest stone is identified within the right renal pelvis measuring 1.3 by 0.7 cm, image 48/2. There is left hydronephrosis and hydroureter. Stone within the distal ureter measures 5 mm, image 83/2 no right-sided hydronephrosis or hydroureter. Bladder is normal.  Stomach/Bowel: Stomach is within normal limits. The appendix is visualized and is normal. Short segment of wall thickening with surrounding inflammation involves the hepatic flexure adjacent to the gallbladder. Vascular/Lymphatic: Aortic atherosclerosis. No abdominopelvic adenopathy. Reproductive: Prostate is unremarkable. Other: There  is a small amount of fluid along the peritoneal reflection at the level of the left iliac fossa, image 78/2. No focal fluid collections identified to suggest an abscess. Musculoskeletal: No acute or significant osseous findings. No acute or suspicious osseous findings. IMPRESSION: 1. Bilateral nephrolithiasis. Left-sided hydronephrosis and hydroureter secondary to 5 mm distal left ureteral calculus. 2. There are inflammatory changes surrounding the gallbladder. No calcified gallstones identified. If there is a clinical concern for acute cholecystitis consider further evaluation with right upper quadrant ultrasound. 3. Short segment of wall thickening with surrounding inflammation involves the hepatic flexure adjacent to the gallbladder. This is favored to represent secondary inflammation of the colon. Less favored would be acute diverticulitis or short segment colitis with secondary inflammation of the gallbladder. 4.  Aortic Atherosclerosis (ICD10-I70.0). Electronically Signed   By: Signa Kell M.D.   On: 09/05/2022 09:33    I independently reviewed the above imaging studies.  Impression/Recommendation 1.  Abdominal pain-questionable related to cholecystitis or other process with gallbladder also has obstructing left ureteral calculus but majority of symptoms appear to be on the right side currently. Plan/recommendation.  Patient admitted for further evaluation with MRI scan for the gallbladder process.  Will strain urine, initiate tamsulosin as medical expulsive therapy.  If patient deteriorates clinically or is febrile may consider stent on the left side.  As he is  currently minimally symptomatic would follow and see if he could pass this spontaneously.  Will follow Further disposition regarding gallbladder process per general surgery.  Belva Agee 09/05/2022, 5:07 PM    Belva Agee, MD   CC:

## 2022-09-06 ENCOUNTER — Encounter (HOSPITAL_COMMUNITY): Payer: Self-pay | Admitting: Family Medicine

## 2022-09-06 ENCOUNTER — Inpatient Hospital Stay (HOSPITAL_COMMUNITY): Payer: Medicare Other

## 2022-09-06 DIAGNOSIS — K810 Acute cholecystitis: Secondary | ICD-10-CM | POA: Diagnosis not present

## 2022-09-06 DIAGNOSIS — R109 Unspecified abdominal pain: Secondary | ICD-10-CM

## 2022-09-06 HISTORY — PX: IR PERC CHOLECYSTOSTOMY: IMG2326

## 2022-09-06 LAB — COMPREHENSIVE METABOLIC PANEL
ALT: 28 U/L (ref 0–44)
AST: 33 U/L (ref 15–41)
Albumin: 3.3 g/dL — ABNORMAL LOW (ref 3.5–5.0)
Alkaline Phosphatase: 56 U/L (ref 38–126)
Anion gap: 10 (ref 5–15)
BUN: 27 mg/dL — ABNORMAL HIGH (ref 8–23)
CO2: 23 mmol/L (ref 22–32)
Calcium: 8.5 mg/dL — ABNORMAL LOW (ref 8.9–10.3)
Chloride: 104 mmol/L (ref 98–111)
Creatinine, Ser: 1.99 mg/dL — ABNORMAL HIGH (ref 0.61–1.24)
GFR, Estimated: 35 mL/min — ABNORMAL LOW (ref >60)
Glucose, Bld: 130 mg/dL — ABNORMAL HIGH (ref 70–99)
Potassium: 4.5 mmol/L (ref 3.5–5.1)
Sodium: 137 mmol/L (ref 135–145)
Total Bilirubin: 1.1 mg/dL (ref 0.3–1.2)
Total Protein: 7 g/dL (ref 6.5–8.1)

## 2022-09-06 LAB — CBC
HCT: 44.3 % (ref 39.0–52.0)
Hemoglobin: 14.4 g/dL (ref 13.0–17.0)
MCH: 31.1 pg (ref 26.0–34.0)
MCHC: 32.5 g/dL (ref 30.0–36.0)
MCV: 95.7 fL (ref 80.0–100.0)
Platelets: 183 10*3/uL (ref 150–400)
RBC: 4.63 MIL/uL (ref 4.22–5.81)
RDW: 13.3 % (ref 11.5–15.5)
WBC: 22.6 10*3/uL — ABNORMAL HIGH (ref 4.0–10.5)
nRBC: 0 % (ref 0.0–0.2)

## 2022-09-06 LAB — AEROBIC/ANAEROBIC CULTURE W GRAM STAIN (SURGICAL/DEEP WOUND)

## 2022-09-06 LAB — PROTIME-INR
INR: 1.4 — ABNORMAL HIGH (ref 0.8–1.2)
Prothrombin Time: 17.2 seconds — ABNORMAL HIGH (ref 11.4–15.2)

## 2022-09-06 MED ORDER — MIDAZOLAM HCL 2 MG/2ML IJ SOLN
INTRAMUSCULAR | Status: AC
  Filled 2022-09-06: qty 2

## 2022-09-06 MED ORDER — HYDROMORPHONE HCL 1 MG/ML IJ SOLN
INTRAMUSCULAR | Status: AC
  Filled 2022-09-06: qty 2

## 2022-09-06 MED ORDER — MIDAZOLAM HCL 2 MG/2ML IJ SOLN
INTRAMUSCULAR | Status: AC | PRN
  Administered 2022-09-06: 1 mg via INTRAVENOUS

## 2022-09-06 MED ORDER — LIDOCAINE HCL 1 % IJ SOLN
INTRAMUSCULAR | Status: AC
  Filled 2022-09-06: qty 20

## 2022-09-06 MED ORDER — ACETAMINOPHEN 325 MG PO TABS
650.0000 mg | ORAL_TABLET | Freq: Four times a day (QID) | ORAL | Status: DC | PRN
  Administered 2022-09-06: 650 mg via ORAL
  Filled 2022-09-06: qty 2

## 2022-09-06 MED ORDER — PIPERACILLIN-TAZOBACTAM 3.375 G IVPB
3.3750 g | Freq: Three times a day (TID) | INTRAVENOUS | Status: DC
  Administered 2022-09-06 – 2022-09-08 (×6): 3.375 g via INTRAVENOUS
  Filled 2022-09-06 (×6): qty 50

## 2022-09-06 MED ORDER — HYDROMORPHONE HCL 1 MG/ML IJ SOLN
INTRAMUSCULAR | Status: AC | PRN
  Administered 2022-09-06: .5 mg via INTRAVENOUS

## 2022-09-06 MED ORDER — SODIUM CHLORIDE 0.9 % IV SOLN
2.0000 g | INTRAVENOUS | Status: AC
  Filled 2022-09-06: qty 2

## 2022-09-06 MED ORDER — IOHEXOL 300 MG/ML  SOLN
10.0000 mL | Freq: Once | INTRAMUSCULAR | Status: AC | PRN
  Administered 2022-09-06: 10 mL

## 2022-09-06 MED ORDER — ENOXAPARIN SODIUM 40 MG/0.4ML IJ SOSY
40.0000 mg | PREFILLED_SYRINGE | INTRAMUSCULAR | Status: DC
  Administered 2022-09-07 – 2022-09-08 (×2): 40 mg via SUBCUTANEOUS
  Filled 2022-09-06 (×2): qty 0.4

## 2022-09-06 NOTE — Progress Notes (Signed)
Subjective: Patient with no flank pain.  Continue to have some abdominal pain.  MRI suggested severe cholecystitis and is undergoing percutaneous cholecystostomy drainage today.  Serum creatinine 1.99 slightly worse but may be related to ongoing gallbladder issues. Vital signs in last 24 hours: Temp:  [98.2 F (36.8 C)-101.2 F (38.4 C)] 98.3 F (36.8 C) (06/23 1035) Pulse Rate:  [83-110] 103 (06/23 1035) Resp:  [9-20] 18 (06/23 1035) BP: (118-159)/(70-107) 121/74 (06/23 1035) SpO2:  [89 %-97 %] 90 % (06/23 1035)  Intake/Output from previous day: 06/22 0701 - 06/23 0700 In: 49 [IV Piggyback:49] Out: 350 [Urine:350] Intake/Output this shift: Total I/O In: 0  Out: 300 [Urine:300]  Physical Exam:  General: Alert and oriented C Lab Results: Recent Labs    09/05/22 0601 09/06/22 0338  HGB 13.3 14.4  HCT 40.1 44.3   BMET Recent Labs    09/05/22 0601 09/06/22 0338  NA 137 137  K 4.0 4.5  CL 105 104  CO2 22 23  GLUCOSE 135* 130*  BUN 27* 27*  CREATININE 1.87* 1.99*  CALCIUM 9.0 8.5*     Studies/Results: MR ABDOMEN WITH MRCP W CONTRAST  Result Date: 09/06/2022 CLINICAL DATA:  Cholecystitis. EXAM: MRI ABDOMEN WITH CONTRAST (WITH MRCP) TECHNIQUE: Multiplanar multisequence MR imaging of the abdomen was performed following the administration of intravenous contrast. Heavily T2-weighted images of the biliary and pancreatic ducts were obtained, and three-dimensional MRCP images were rendered by post processing. CONTRAST:  10mL GADAVIST GADOBUTROL 1 MMOL/ML IV SOLN COMPARISON:  Ultrasound and CT exams from earlier the same day FINDINGS: Lower chest: Dependent atelectasis. Hepatobiliary: No suspicious focal abnormality within the liver parenchyma. Gallbladder is distended with gallbladder wall thickening/edema. Multiple very tiny 1-2 mm stones are seen dependently in the lumen of gallbladder. There is extensive edema/inflammation in the gallbladder fossa extending down under the  liver to involve the hepatic flexure of the colon. Edema/inflammation tracks in the hepatoduodenal ligament and retroperitoneal spaces to the descending duodenum. Trace fluid is seen at the inferior tip of the right liver. No intra or extrahepatic biliary duct dilatation. Common bile duct diameter is upper normal at 6 mm in the head of the pancreas. MRCP imaging shows no choledocholithiasis. Pancreas: No focal mass lesion. No dilatation of the main duct. No intraparenchymal cyst. No peripancreatic edema. Spleen:  No splenomegaly. No suspicious focal mass lesion. Adrenals/Urinary Tract: No adrenal nodule or mass. Tiny well-defined homogeneous T2 hyperintensities are seen in both kidneys, incompletely characterized on postcontrast imaging most compatible with simple cyst. No followup imaging is recommended. Stomach/Bowel: Stomach is unremarkable. No gastric wall thickening. No evidence of outlet obstruction. Duodenum is normally positioned as is the ligament of Treitz. No small bowel or colonic dilatation within the visualized abdomen. Vascular/Lymphatic: No abdominal aortic aneurysm. No abdominal aortic atherosclerotic calcification. There is no gastrohepatic or hepatoduodenal ligament lymphadenopathy. No retroperitoneal or mesenteric lymphadenopathy. Other: Trace free fluid in the right upper quadrant as described above. Musculoskeletal: No focal suspicious marrow enhancement within the visualized bony anatomy. IMPRESSION: 1. Cholelithiasis with gallbladder wall thickening/edema and extensive edema/inflammation in the gallbladder fossa extending down under the liver to involve the hepatic flexure of the colon. Edema/inflammation tracks in the hepatoduodenal ligament and retroperitoneal space to the descending duodenum. Imaging features are compatible with acute cholecystitis. 2. No intra or extrahepatic biliary duct dilatation. Common bile duct diameter is upper normal at 6 mm in the head of the pancreas. MRCP  imaging shows no choledocholithiasis. Electronically Signed   By: Minerva Areola  Molli Posey M.D.   On: 09/06/2022 07:00   MR 3D Recon At Scanner  Result Date: 09/06/2022 CLINICAL DATA:  Cholecystitis. EXAM: MRI ABDOMEN WITH CONTRAST (WITH MRCP) TECHNIQUE: Multiplanar multisequence MR imaging of the abdomen was performed following the administration of intravenous contrast. Heavily T2-weighted images of the biliary and pancreatic ducts were obtained, and three-dimensional MRCP images were rendered by post processing. CONTRAST:  10mL GADAVIST GADOBUTROL 1 MMOL/ML IV SOLN COMPARISON:  Ultrasound and CT exams from earlier the same day FINDINGS: Lower chest: Dependent atelectasis. Hepatobiliary: No suspicious focal abnormality within the liver parenchyma. Gallbladder is distended with gallbladder wall thickening/edema. Multiple very tiny 1-2 mm stones are seen dependently in the lumen of gallbladder. There is extensive edema/inflammation in the gallbladder fossa extending down under the liver to involve the hepatic flexure of the colon. Edema/inflammation tracks in the hepatoduodenal ligament and retroperitoneal spaces to the descending duodenum. Trace fluid is seen at the inferior tip of the right liver. No intra or extrahepatic biliary duct dilatation. Common bile duct diameter is upper normal at 6 mm in the head of the pancreas. MRCP imaging shows no choledocholithiasis. Pancreas: No focal mass lesion. No dilatation of the main duct. No intraparenchymal cyst. No peripancreatic edema. Spleen:  No splenomegaly. No suspicious focal mass lesion. Adrenals/Urinary Tract: No adrenal nodule or mass. Tiny well-defined homogeneous T2 hyperintensities are seen in both kidneys, incompletely characterized on postcontrast imaging most compatible with simple cyst. No followup imaging is recommended. Stomach/Bowel: Stomach is unremarkable. No gastric wall thickening. No evidence of outlet obstruction. Duodenum is normally positioned as is  the ligament of Treitz. No small bowel or colonic dilatation within the visualized abdomen. Vascular/Lymphatic: No abdominal aortic aneurysm. No abdominal aortic atherosclerotic calcification. There is no gastrohepatic or hepatoduodenal ligament lymphadenopathy. No retroperitoneal or mesenteric lymphadenopathy. Other: Trace free fluid in the right upper quadrant as described above. Musculoskeletal: No focal suspicious marrow enhancement within the visualized bony anatomy. IMPRESSION: 1. Cholelithiasis with gallbladder wall thickening/edema and extensive edema/inflammation in the gallbladder fossa extending down under the liver to involve the hepatic flexure of the colon. Edema/inflammation tracks in the hepatoduodenal ligament and retroperitoneal space to the descending duodenum. Imaging features are compatible with acute cholecystitis. 2. No intra or extrahepatic biliary duct dilatation. Common bile duct diameter is upper normal at 6 mm in the head of the pancreas. MRCP imaging shows no choledocholithiasis. Electronically Signed   By: Kennith Center M.D.   On: 09/06/2022 07:00   US Abdomen Limited RUQ (LIVER/GB)  Result Date: 09/05/2022 CLINICAL DATA:  Abdominal pain for 3 days. EXAM: ULTRASOUND ABDOMEN LIMITED RIGHT UPPER QUADRANT COMPARISON:  CT [facility] from earlier today FINDINGS: Gallbladder: Gallbladder appears distended and is filled with sludge. Gallbladder wall thickness measures up to 6 mm. No stones identified. A negative sonographic Murphy's sign was reported by the sonographer. Common bile duct: Diameter: 5.0 mm Liver: Increased parenchymal echogenicity. No focal liver abnormality. Portal vein is patent on color Doppler imaging with normal direction of blood flow towards the liver. Other: Exam detail is diminished secondary to overlying bowel gas and patient's body habitus. IMPRESSION: 1. Diminished exam detail due to body habitus and overlying bowel gas. 2. The gallbladder is distended and filled with  sludge. There is gallbladder wall thickening measuring up to 6 mm. No stones identified. Negative sonographic Murphy's sign. 3. Despite absence of gallstones and positive sonographic Murphy's sign there is considerable inflammatory change noted within the right upper quadrant surrounding hepatic flexure and gallbladder on the CT  from earlier today. Those findings remain suspicious for either acute cholecystitis or segmental colitis with secondary inflammation of the gallbladder. 4. Increased echogenicity of the liver parenchyma compatible with hepatic steatosis. Electronically Signed   By: Signa Kell M.D.   On: 09/05/2022 11:16   CT Renal Stone Study  Result Date: 09/05/2022 CLINICAL DATA:  Abdominal/flank pain.  Evaluate for kidney stone. EXAM: CT ABDOMEN AND PELVIS WITHOUT CONTRAST TECHNIQUE: Multidetector CT imaging of the abdomen and pelvis was performed following the standard protocol without IV contrast. RADIATION DOSE REDUCTION: This exam was performed according to the departmental dose-optimization program which includes automated exposure control, adjustment of the mA and/or kV according to patient size and/or use of iterative reconstruction technique. COMPARISON:  08/21/2018 FINDINGS: Lower chest: Bilateral pleural thickening and lower lobe subsegmental atelectasis is identified. No pleural fluid or airspace consolidation. Hepatobiliary: No suspicious liver lesion identified. There are inflammatory changes surrounding the gallbladder with possible gallbladder wall thickening (difficult to assess reflecting lack of IV contrast). Focal area of low density adjacent to the gallbladder fossa within segment 4 B is identified and is favored to represent focal fatty deposition, image 23/2. Fat stranding in is also noted extending around the hepatic flexure which exhibits bowel wall edema. No calcified gallstones identified. No signs of bile duct dilatation. Pancreas: Unremarkable. No pancreatic ductal  dilatation or surrounding inflammatory changes. Spleen: Normal in size without focal abnormality. Adrenals/Urinary Tract: Normal adrenal glands. Bilateral nephrolithiasis. The largest stone is identified within the right renal pelvis measuring 1.3 by 0.7 cm, image 48/2. There is left hydronephrosis and hydroureter. Stone within the distal ureter measures 5 mm, image 83/2 no right-sided hydronephrosis or hydroureter. Bladder is normal. Stomach/Bowel: Stomach is within normal limits. The appendix is visualized and is normal. Short segment of wall thickening with surrounding inflammation involves the hepatic flexure adjacent to the gallbladder. Vascular/Lymphatic: Aortic atherosclerosis. No abdominopelvic adenopathy. Reproductive: Prostate is unremarkable. Other: There is a small amount of fluid along the peritoneal reflection at the level of the left iliac fossa, image 78/2. No focal fluid collections identified to suggest an abscess. Musculoskeletal: No acute or significant osseous findings. No acute or suspicious osseous findings. IMPRESSION: 1. Bilateral nephrolithiasis. Left-sided hydronephrosis and hydroureter secondary to 5 mm distal left ureteral calculus. 2. There are inflammatory changes surrounding the gallbladder. No calcified gallstones identified. If there is a clinical concern for acute cholecystitis consider further evaluation with right upper quadrant ultrasound. 3. Short segment of wall thickening with surrounding inflammation involves the hepatic flexure adjacent to the gallbladder. This is favored to represent secondary inflammation of the colon. Less favored would be acute diverticulitis or short segment colitis with secondary inflammation of the gallbladder. 4.  Aortic Atherosclerosis (ICD10-I70.0). Electronically Signed   By: Signa Kell M.D.   On: 09/05/2022 09:33    Assessment/Plan: 1.  Acute cholecystitis now requiring percutaneous cholecystostomy drain 2.  Left distal ureteral  calculus, minimally symptomatic but slightly worsening renal function Plan/recommendation.  Will follow clinically if patient continues to have worsening renal function or not improving after cholecystostomy tube will consider reassessment of the stone with noncontrasted CT and/or possible stent.  Sinew to strain urine.    LOS: 1 day   Belva Agee 09/06/2022, 10:36 AM

## 2022-09-06 NOTE — Progress Notes (Signed)
[facility]  PROGRESS NOTE  TRESON LAURA HYW:[id number] DOB: January 26, 1951 DOA: 09/05/2022 PCP: Benita Stabile, MD   Brief HPI:   72 year old male with medical history of prior kidney stones, hypertension, hyperlipidemia came to [facility] ER with right-sided flank pain right upper quadrant pain.  Patient reports passing 4 kidney stone in the past 2 to 3 days then he began to feel better.  However 2 days ago he started having right upper quadrant pain.  In the ED CT scan showed bilateral nephrolithiasis, left-sided hydronephrosis and hydroureter secondary to 5 mm distal left ureteral calculus.  There was also inflammatory changes at the gallbladder, right upper quadrant ultrasound showed significant inflammation of the gallbladder consistent with cholecystitis.  Also bowel segment with wall thickening around the hepatic flexure suspected to be secondary to gallbladder inflammation.  General surgery was consulted, recommended medical admission and consult with urology.  MRI abdomen was ordered.    Assessment/Plan:   Cholecystitis -Seen on CT abdomen/pelvis, -MRI abdomen showed extensive edema inflammation of the gallbladder with significant extension to surrounding bowel, no evidence of stone in the common bile duct -General surgery recommended cholecystostomy tube and delay cholecystectomy -IR was consulted for cholecystostomy tube placement; underwent tube placement today -Still has significant leukocytosis with WBC 22,000 -Will continue with Zosyn per pharmacy  Nephrolithiasis with left hydronephrosis -5 mm distal left ureteral calculus -Urology was consulted, recommended IV fluids and tamsulosin to help with passing stone  Hypertension -Blood pressure is stable -Continue hydralazine as needed -HCTZ and Cozaar on hold due to AKI  Acute kidney injury -Presented with creatinine of 1.87, increased to 1.99 today -Continue normal saline at 100 mL/h -Follow serum creatinine in a.m. -Avoid  nephrotoxins    Medications     [START ON 09/07/2022] enoxaparin (LOVENOX) injection  40 mg Subcutaneous Q24H   tamsulosin  0.4 mg Oral Daily     Data Reviewed:   CBG:  No results for input(s): "GLUCAP" in the last 168 hours.  SpO2: 93 % O2 Flow Rate (L/min): 2 L/min    Vitals:   09/06/22 1035 09/06/22 1105 09/06/22 1139 09/06/22 1227  BP: 121/74 125/77 115/69 114/72  Pulse: (!) 103 96 93 89  Resp: 18 18 18 18   Temp: 98.3 F (36.8 C) 98.2 F (36.8 C) 98.4 F (36.9 C) 98 F (36.7 C)  TempSrc: Oral Oral Oral Oral  SpO2: 90% (!) 89% (!) 89% 93%  Weight:      Height:          Data Reviewed:  Basic Metabolic Panel: Recent Labs  Lab 09/05/22 0601 09/06/22 0338  NA 137 137  K 4.0 4.5  CL 105 104  CO2 22 23  GLUCOSE 135* 130*  BUN 27* 27*  CREATININE 1.87* 1.99*  CALCIUM 9.0 8.5*    CBC: Recent Labs  Lab 09/05/22 0601 09/06/22 0338  WBC 15.3* 22.6*  NEUTROABS 11.6*  --   HGB 13.3 14.4  HCT 40.1 44.3  MCV 94.4 95.7  PLT 160 183    LFT Recent Labs  Lab 09/05/22 0601 09/06/22 0338  AST 28 33  ALT 27 28  ALKPHOS 54 56  BILITOT 1.0 1.1  PROT 7.3 7.0  ALBUMIN 3.8 3.3*     Antibiotics: Anti-infectives (From admission, onward)    Start     Dose/Rate Route Frequency Ordered Stop   09/06/22 1000  cefOXitin (MEFOXIN) 2 g in sodium chloride 0.9 % 100 mL IVPB       Note to  Pharmacy: To Interventional Radiology.   2 g 200 mL/hr over 30 Minutes Intravenous To Radiology 09/06/22 0911 09/07/22 1000   09/05/22 1200  piperacillin-tazobactam (ZOSYN) IVPB 3.375 g        3.375 g 100 mL/hr over 30 Minutes Intravenous  Once 09/05/22 1147 09/05/22 1230        DVT prophylaxis: Lovenox  Code Status: Full code  Family Communication: Discussed with patient's wife at bedside  CONSULTS    Subjective   Patient seen and examined, underwent cholecystostomy tube placement this morning.  Denies abdominal pain.  Still has suprapubic discomfort from  passing ureteral stone.   Objective    Physical Examination:   General-appears in no acute distress Heart-S1-S2, regular, no murmur auscultated Lungs-clear to auscultation bilaterally, no wheezing or crackles auscultated Abdomen-soft, nontender, no organomegaly Extremities-no edema in the lower extremities Neuro-alert, oriented x3, no focal deficit noted  Status is: Inpatient:             Meredeth Ide   [facility] If 7PM-7AM, please contact night-coverage at [email or web address], Office  [phone]   09/06/2022, 2:07 PM  LOS: 1 day

## 2022-09-06 NOTE — Procedures (Signed)
Interventional Radiology Procedure Note  Procedure: Placement of a 11F transperitoneal percutaneous cholecystostomy tube.   Complications: None  Estimated Blood Loss: None  Recommendations:  - Drain to bag   Signed,  Sterling Big, MD

## 2022-09-06 NOTE — Progress Notes (Signed)
Subjective/Chief Complaint: Patient reports much less RUQ abdominal pain Reports having blood in his urine   Objective: Vital signs in last 24 hours: Temp:  [98.1 F (36.7 C)-101.2 F (38.4 C)] 98.8 F (37.1 C) (06/23 0438) Pulse Rate:  [68-108] 108 (06/23 0438) Resp:  [15-20] 20 (06/23 0438) BP: (118-159)/(70-107) 124/82 (06/23 0438) SpO2:  [89 %-99 %] 90 % (06/23 0438) Last BM Date : 09/04/22  Intake/Output from previous day: 06/22 0701 - 06/23 0700 In: 49 [IV Piggyback:49] Out: 350 [Urine:350] Intake/Output this shift: No intake/output data recorded.  Exam: Awake and alert Abdomen obese, tender with guarding in the RUQ but less than yesterday  Lab Results:  Recent Labs    09/05/22 0601 09/06/22 0338  WBC 15.3* 22.6*  HGB 13.3 14.4  HCT 40.1 44.3  PLT 160 183   BMET Recent Labs    09/05/22 0601 09/06/22 0338  NA 137 137  K 4.0 4.5  CL 105 104  CO2 22 23  GLUCOSE 135* 130*  BUN 27* 27*  CREATININE 1.87* 1.99*  CALCIUM 9.0 8.5*   PT/INR No results for input(s): "LABPROT", "INR" in the last 72 hours. ABG No results for input(s): "PHART", "HCO3" in the last 72 hours.  Invalid input(s): "PCO2", "PO2"  Studies/Results: MR ABDOMEN WITH MRCP W CONTRAST  Result Date: 09/06/2022 CLINICAL DATA:  Cholecystitis. EXAM: MRI ABDOMEN WITH CONTRAST (WITH MRCP) TECHNIQUE: Multiplanar multisequence MR imaging of the abdomen was performed following the administration of intravenous contrast. Heavily T2-weighted images of the biliary and pancreatic ducts were obtained, and three-dimensional MRCP images were rendered by post processing. CONTRAST:  10mL GADAVIST GADOBUTROL 1 MMOL/ML IV SOLN COMPARISON:  Ultrasound and CT exams from earlier the same day FINDINGS: Lower chest: Dependent atelectasis. Hepatobiliary: No suspicious focal abnormality within the liver parenchyma. Gallbladder is distended with gallbladder wall thickening/edema. Multiple very tiny 1-2 mm stones  are seen dependently in the lumen of gallbladder. There is extensive edema/inflammation in the gallbladder fossa extending down under the liver to involve the hepatic flexure of the colon. Edema/inflammation tracks in the hepatoduodenal ligament and retroperitoneal spaces to the descending duodenum. Trace fluid is seen at the inferior tip of the right liver. No intra or extrahepatic biliary duct dilatation. Common bile duct diameter is upper normal at 6 mm in the head of the pancreas. MRCP imaging shows no choledocholithiasis. Pancreas: No focal mass lesion. No dilatation of the main duct. No intraparenchymal cyst. No peripancreatic edema. Spleen:  No splenomegaly. No suspicious focal mass lesion. Adrenals/Urinary Tract: No adrenal nodule or mass. Tiny well-defined homogeneous T2 hyperintensities are seen in both kidneys, incompletely characterized on postcontrast imaging most compatible with simple cyst. No followup imaging is recommended. Stomach/Bowel: Stomach is unremarkable. No gastric wall thickening. No evidence of outlet obstruction. Duodenum is normally positioned as is the ligament of Treitz. No small bowel or colonic dilatation within the visualized abdomen. Vascular/Lymphatic: No abdominal aortic aneurysm. No abdominal aortic atherosclerotic calcification. There is no gastrohepatic or hepatoduodenal ligament lymphadenopathy. No retroperitoneal or mesenteric lymphadenopathy. Other: Trace free fluid in the right upper quadrant as described above. Musculoskeletal: No focal suspicious marrow enhancement within the visualized bony anatomy. IMPRESSION: 1. Cholelithiasis with gallbladder wall thickening/edema and extensive edema/inflammation in the gallbladder fossa extending down under the liver to involve the hepatic flexure of the colon. Edema/inflammation tracks in the hepatoduodenal ligament and retroperitoneal space to the descending duodenum. Imaging features are compatible with acute cholecystitis. 2.  No intra or extrahepatic biliary duct dilatation. Common  bile duct diameter is upper normal at 6 mm in the head of the pancreas. MRCP imaging shows no choledocholithiasis. Electronically Signed   By: Kennith Center M.D.   On: 09/06/2022 07:00   MR 3D Recon At Scanner  Result Date: 09/06/2022 CLINICAL DATA:  Cholecystitis. EXAM: MRI ABDOMEN WITH CONTRAST (WITH MRCP) TECHNIQUE: Multiplanar multisequence MR imaging of the abdomen was performed following the administration of intravenous contrast. Heavily T2-weighted images of the biliary and pancreatic ducts were obtained, and three-dimensional MRCP images were rendered by post processing. CONTRAST:  10mL GADAVIST GADOBUTROL 1 MMOL/ML IV SOLN COMPARISON:  Ultrasound and CT exams from earlier the same day FINDINGS: Lower chest: Dependent atelectasis. Hepatobiliary: No suspicious focal abnormality within the liver parenchyma. Gallbladder is distended with gallbladder wall thickening/edema. Multiple very tiny 1-2 mm stones are seen dependently in the lumen of gallbladder. There is extensive edema/inflammation in the gallbladder fossa extending down under the liver to involve the hepatic flexure of the colon. Edema/inflammation tracks in the hepatoduodenal ligament and retroperitoneal spaces to the descending duodenum. Trace fluid is seen at the inferior tip of the right liver. No intra or extrahepatic biliary duct dilatation. Common bile duct diameter is upper normal at 6 mm in the head of the pancreas. MRCP imaging shows no choledocholithiasis. Pancreas: No focal mass lesion. No dilatation of the main duct. No intraparenchymal cyst. No peripancreatic edema. Spleen:  No splenomegaly. No suspicious focal mass lesion. Adrenals/Urinary Tract: No adrenal nodule or mass. Tiny well-defined homogeneous T2 hyperintensities are seen in both kidneys, incompletely characterized on postcontrast imaging most compatible with simple cyst. No followup imaging is recommended.  Stomach/Bowel: Stomach is unremarkable. No gastric wall thickening. No evidence of outlet obstruction. Duodenum is normally positioned as is the ligament of Treitz. No small bowel or colonic dilatation within the visualized abdomen. Vascular/Lymphatic: No abdominal aortic aneurysm. No abdominal aortic atherosclerotic calcification. There is no gastrohepatic or hepatoduodenal ligament lymphadenopathy. No retroperitoneal or mesenteric lymphadenopathy. Other: Trace free fluid in the right upper quadrant as described above. Musculoskeletal: No focal suspicious marrow enhancement within the visualized bony anatomy. IMPRESSION: 1. Cholelithiasis with gallbladder wall thickening/edema and extensive edema/inflammation in the gallbladder fossa extending down under the liver to involve the hepatic flexure of the colon. Edema/inflammation tracks in the hepatoduodenal ligament and retroperitoneal space to the descending duodenum. Imaging features are compatible with acute cholecystitis. 2. No intra or extrahepatic biliary duct dilatation. Common bile duct diameter is upper normal at 6 mm in the head of the pancreas. MRCP imaging shows no choledocholithiasis. Electronically Signed   By: Kennith Center M.D.   On: 09/06/2022 07:00   US Abdomen Limited RUQ (LIVER/GB)  Result Date: 09/05/2022 CLINICAL DATA:  Abdominal pain for 3 days. EXAM: ULTRASOUND ABDOMEN LIMITED RIGHT UPPER QUADRANT COMPARISON:  CT AP from earlier today FINDINGS: Gallbladder: Gallbladder appears distended and is filled with sludge. Gallbladder wall thickness measures up to 6 mm. No stones identified. A negative sonographic Murphy's sign was reported by the sonographer. Common bile duct: Diameter: 5.0 mm Liver: Increased parenchymal echogenicity. No focal liver abnormality. Portal vein is patent on color Doppler imaging with normal direction of blood flow towards the liver. Other: Exam detail is diminished secondary to overlying bowel gas and patient's body  habitus. IMPRESSION: 1. Diminished exam detail due to body habitus and overlying bowel gas. 2. The gallbladder is distended and filled with sludge. There is gallbladder wall thickening measuring up to 6 mm. No stones identified. Negative sonographic Murphy's sign. 3. Despite  absence of gallstones and positive sonographic Murphy's sign there is considerable inflammatory change noted within the right upper quadrant surrounding hepatic flexure and gallbladder on the CT from earlier today. Those findings remain suspicious for either acute cholecystitis or segmental colitis with secondary inflammation of the gallbladder. 4. Increased echogenicity of the liver parenchyma compatible with hepatic steatosis. Electronically Signed   By: Signa Kell M.D.   On: 09/05/2022 11:16   CT Renal Stone Study  Result Date: 09/05/2022 CLINICAL DATA:  Abdominal/flank pain.  Evaluate for kidney stone. EXAM: CT ABDOMEN AND PELVIS WITHOUT CONTRAST TECHNIQUE: Multidetector CT imaging of the abdomen and pelvis was performed following the standard protocol without IV contrast. RADIATION DOSE REDUCTION: This exam was performed according to the departmental dose-optimization program which includes automated exposure control, adjustment of the mA and/or kV according to patient size and/or use of iterative reconstruction technique. COMPARISON:  08/21/2018 FINDINGS: Lower chest: Bilateral pleural thickening and lower lobe subsegmental atelectasis is identified. No pleural fluid or airspace consolidation. Hepatobiliary: No suspicious liver lesion identified. There are inflammatory changes surrounding the gallbladder with possible gallbladder wall thickening (difficult to assess reflecting lack of IV contrast). Focal area of low density adjacent to the gallbladder fossa within segment 4 B is identified and is favored to represent focal fatty deposition, image 23/2. Fat stranding in is also noted extending around the hepatic flexure which  exhibits bowel wall edema. No calcified gallstones identified. No signs of bile duct dilatation. Pancreas: Unremarkable. No pancreatic ductal dilatation or surrounding inflammatory changes. Spleen: Normal in size without focal abnormality. Adrenals/Urinary Tract: Normal adrenal glands. Bilateral nephrolithiasis. The largest stone is identified within the right renal pelvis measuring 1.3 by 0.7 cm, image 48/2. There is left hydronephrosis and hydroureter. Stone within the distal ureter measures 5 mm, image 83/2 no right-sided hydronephrosis or hydroureter. Bladder is normal. Stomach/Bowel: Stomach is within normal limits. The appendix is visualized and is normal. Short segment of wall thickening with surrounding inflammation involves the hepatic flexure adjacent to the gallbladder. Vascular/Lymphatic: Aortic atherosclerosis. No abdominopelvic adenopathy. Reproductive: Prostate is unremarkable. Other: There is a small amount of fluid along the peritoneal reflection at the level of the left iliac fossa, image 78/2. No focal fluid collections identified to suggest an abscess. Musculoskeletal: No acute or significant osseous findings. No acute or suspicious osseous findings. IMPRESSION: 1. Bilateral nephrolithiasis. Left-sided hydronephrosis and hydroureter secondary to 5 mm distal left ureteral calculus. 2. There are inflammatory changes surrounding the gallbladder. No calcified gallstones identified. If there is a clinical concern for acute cholecystitis consider further evaluation with right upper quadrant ultrasound. 3. Short segment of wall thickening with surrounding inflammation involves the hepatic flexure adjacent to the gallbladder. This is favored to represent secondary inflammation of the colon. Less favored would be acute diverticulitis or short segment colitis with secondary inflammation of the gallbladder. 4.  Aortic Atherosclerosis (ICD10-I70.0). Electronically Signed   By: Signa Kell M.D.   On:  09/05/2022 09:33    Anti-infectives: Anti-infectives (From admission, onward)    Start     Dose/Rate Route Frequency Ordered Stop   09/05/22 1200  piperacillin-tazobactam (ZOSYN) IVPB 3.375 g        3.375 g 100 mL/hr over 30 Minutes Intravenous  Once 09/05/22 1147 09/05/22 1230       Assessment/Plan: Severe acute cholecystitis with cholelithiasis Left ureteral stone  MRI shows extensive edema and inflammation of the gallbladder with significant extension to the surrounding bowel.  There is no evidence of a stone  in the bile duct.  WBC and Creatinine are up  Give the significant MRI findings, he would be at a much higher risk of the need to convert to an open cholecystectomy and a higher risk of bile duct injury or injury to the surrounding structures. We recommend IR place a percutaneous cholecystostomy tube with an eventual lap chole at a later date.  We have notified IR.  Still uncertain whether or not he has passed the kidney stone  I explained the need for a drain  to the patient and he agrees with the plans  Complex medical decision making    LOS: 1 day    Abigail Miyamoto 09/06/2022

## 2022-09-06 NOTE — Progress Notes (Signed)
MEDICATION-RELATED CONSULT NOTE   IR Procedure Consult - Anticoagulant/Antiplatelet PTA/Inpatient Med List Review by Pharmacist    Procedure: placement of transperitoneal percutaneous cholecystostomy tube    Completed: 6/23 ~1021  Post-Procedural bleeding risk per IR MD assessment:  standard  Antithrombotic medications on inpatient or PTA profile prior to procedure:   Lovenox 40mg  q24    Recommended restart time per IR Post-Procedure Guidelines:  Day + 1 (next AM)   Other considerations:      Plan:    Restart Lovenox 40mg  q24 6/24 AM   Hessie Knows, PharmD, BCPS Secure Chat if ?s 09/06/2022 11:14 AM

## 2022-09-06 NOTE — Consult Note (Addendum)
Chief Complaint: Patient was seen in consultation today for perc chole placement  Chief Complaint  Patient presents with   Flank Pain   at the request of Magnus Ivan, D.   Referring Physician(s):  Magnus Ivan, D.   Supervising Physician: Malachy Moan  Patient Status: [facility] - In-pt  History of Present Illness: Frank Gilmore is a 72 y.o. male with PMHs of HTN, HLD, kidney stones and acute cholecystitis who presents for perc chole placement.   Patient presented to ED on 6/22 due to abd pain, CBC showed leukocytosis with left shift, CMP showed normal LFT but mildly impaired renal function. T max on 6/22 101.2, other vitals were stable.  CT AP w/o was obtained in ED which showed bilateral kidney stones, left hydronephroses/hydroureter and inflammatory changes surrounding GB.  US abdomen showed distended GB  filled with sludge and gallbladder wall thickening  concerning for acute cholecystitis. General surgery and urology were consulted and patient was admitted for for further eval and management, MR MRCP this morning showed:   1. Cholelithiasis with gallbladder wall thickening/edema and extensive edema/inflammation in the gallbladder fossa extending down under the liver to involve the hepatic flexure of the colon. Edema/inflammation tracks in the hepatoduodenal ligament and retroperitoneal space to the descending duodenum. Imaging features are compatible with acute cholecystitis. 2. No intra or extrahepatic biliary duct dilatation. Common bile duct diameter is upper normal at 6 mm in the head of the pancreas. MRCP imaging shows no choledocholithiasis.   IR was requested for image guided percutaneous cholecystostomy tube placement by general surgery team.  Case reviewed and approved by Dr. Archer Asa.   Patient laying in bed, not in acute distress.  Reports mild frontal HA, SOB, and epigastric pain this morning, RUQ is still tender but much better than yesterday. Also reports  intermittent fever.  Denise  chills,  cough,  nausea ,vomiting, and bleeding.   Past Medical History:  Diagnosis Date   Hyperlipidemia    Hypertension    Kidney stone     Past Surgical History:  Procedure Laterality Date   LITHOTRIPSY      Allergies: Patient has no known allergies.  Medications: Prior to Admission medications   Medication Sig Start Date End Date Taking? Authorizing Provider  aspirin EC 81 MG tablet Take 81 mg by mouth daily.     Yes [provider]  hydrochlorothiazide (HYDRODIURIL) 12.5 MG tablet Take 12.5 mg by mouth daily. 07/01/18  Yes [provider]  losartan (COZAAR) 100 MG tablet Take 100 mg by mouth daily.     Yes [provider]  Multiple Vitamins-Minerals (MULTIVITAMINS THER. W/MINERALS) TABS Take 1 tablet by mouth daily.     Yes [provider]  naproxen sodium (ANAPROX) 220 MG tablet Take 220 mg by mouth every 8 (eight) hours as needed. For pain    Yes [provider]  pravastatin (PRAVACHOL) 40 MG tablet Take 1 tablet by mouth daily. 08/27/22  Yes [provider]     History reviewed. No pertinent family history.  Social History   Socioeconomic History   Marital status: Married    Spouse name: Not on file   Number of children: Not on file   Years of education: Not on file   Highest education level: Not on file  Occupational History   Not on file  Tobacco Use   Smoking status: Former    Types: Cigarettes   Smokeless tobacco: Never  Vaping Use   Vaping Use: Never used  Substance  and Sexual Activity   Alcohol use: No   Drug use: No   Sexual activity: Not on file  Other Topics Concern   Not on file  Social History Narrative   Not on file   Social Determinants of Health   Financial Resource Strain: Not on file  Food Insecurity: Not on file  Transportation Needs: Not on file  Physical Activity: Not on file  Stress: Not on file  Social Connections: Not on file     Review of  Systems: A 12 point ROS discussed and pertinent positives are indicated in the HPI above.  All other systems are negative.  Vital Signs: BP 124/82 (BP Location: Left Arm)   Pulse (!) 108   Temp 98.8 F (37.1 C) (Oral)   Resp 20   Ht 5\' 8"  (1.727 m)   Wt 220 lb (99.8 kg)   SpO2 90%   BMI 33.45 kg/m    Physical Exam Vitals and nursing note reviewed.  Constitutional:      General: Patient is not in acute distress.    Appearance: Normal appearance. Patient is not ill-appearing.  HENT:     Head: Normocephalic and atraumatic.     Mouth/Throat:     Mouth: Mucous membranes are moist.     Pharynx: Oropharynx is clear.  Cardiovascular:     Rate and Rhythm: Normal rate and regular rhythm.     Pulses: Normal pulses.     Heart sounds: Normal heart sounds.  Pulmonary:     Effort: Pulmonary effort is normal.     Breath sounds: Normal breath sounds.  Abdominal:     General: Abdomen is flat. Bowel sounds are normal.     Palpations: Abdomen is soft.  Musculoskeletal:     Cervical back: Neck supple.  Skin:    General: Skin is warm and dry.     Coloration: Skin is not jaundiced or pale.  Neurological:     Mental Status: Patient is alert and oriented to person, place, and time.  Psychiatric:        Mood and Affect: Mood normal.        Behavior: Behavior normal.        Judgment: Judgment normal.    MD Evaluation Airway: WNL Heart: WNL Abdomen: WNL Chest/ Lungs: WNL ASA  Classification: 3 Mallampati/Airway Score: Two  Imaging: MR ABDOMEN WITH MRCP W CONTRAST  Result Date: 09/06/2022 CLINICAL DATA:  Cholecystitis. EXAM: MRI ABDOMEN WITH CONTRAST (WITH MRCP) TECHNIQUE: Multiplanar multisequence MR imaging of the abdomen was performed following the administration of intravenous contrast. Heavily T2-weighted images of the biliary and pancreatic ducts were obtained, and three-dimensional MRCP images were rendered by post processing. CONTRAST:  10mL GADAVIST GADOBUTROL 1 MMOL/ML IV SOLN  COMPARISON:  Ultrasound and CT exams from earlier the same day FINDINGS: Lower chest: Dependent atelectasis. Hepatobiliary: No suspicious focal abnormality within the liver parenchyma. Gallbladder is distended with gallbladder wall thickening/edema. Multiple very tiny 1-2 mm stones are seen dependently in the lumen of gallbladder. There is extensive edema/inflammation in the gallbladder fossa extending down under the liver to involve the hepatic flexure of the colon. Edema/inflammation tracks in the hepatoduodenal ligament and retroperitoneal spaces to the descending duodenum. Trace fluid is seen at the inferior tip of the right liver. No intra or extrahepatic biliary duct dilatation. Common bile duct diameter is upper normal at 6 mm in the head of the pancreas. MRCP imaging shows no choledocholithiasis. Pancreas: No focal mass lesion. No dilatation of the main  duct. No intraparenchymal cyst. No peripancreatic edema. Spleen:  No splenomegaly. No suspicious focal mass lesion. Adrenals/Urinary Tract: No adrenal nodule or mass. Tiny well-defined homogeneous T2 hyperintensities are seen in both kidneys, incompletely characterized on postcontrast imaging most compatible with simple cyst. No followup imaging is recommended. Stomach/Bowel: Stomach is unremarkable. No gastric wall thickening. No evidence of outlet obstruction. Duodenum is normally positioned as is the ligament of Treitz. No small bowel or colonic dilatation within the visualized abdomen. Vascular/Lymphatic: No abdominal aortic aneurysm. No abdominal aortic atherosclerotic calcification. There is no gastrohepatic or hepatoduodenal ligament lymphadenopathy. No retroperitoneal or mesenteric lymphadenopathy. Other: Trace free fluid in the right upper quadrant as described above. Musculoskeletal: No focal suspicious marrow enhancement within the visualized bony anatomy. IMPRESSION: 1. Cholelithiasis with gallbladder wall thickening/edema and extensive  edema/inflammation in the gallbladder fossa extending down under the liver to involve the hepatic flexure of the colon. Edema/inflammation tracks in the hepatoduodenal ligament and retroperitoneal space to the descending duodenum. Imaging features are compatible with acute cholecystitis. 2. No intra or extrahepatic biliary duct dilatation. Common bile duct diameter is upper normal at 6 mm in the head of the pancreas. MRCP imaging shows no choledocholithiasis. Electronically Signed   By: Kennith Center M.D.   On: 09/06/2022 07:00   MR 3D Recon At Scanner  Result Date: 09/06/2022 CLINICAL DATA:  Cholecystitis. EXAM: MRI ABDOMEN WITH CONTRAST (WITH MRCP) TECHNIQUE: Multiplanar multisequence MR imaging of the abdomen was performed following the administration of intravenous contrast. Heavily T2-weighted images of the biliary and pancreatic ducts were obtained, and three-dimensional MRCP images were rendered by post processing. CONTRAST:  10mL GADAVIST GADOBUTROL 1 MMOL/ML IV SOLN COMPARISON:  Ultrasound and CT exams from earlier the same day FINDINGS: Lower chest: Dependent atelectasis. Hepatobiliary: No suspicious focal abnormality within the liver parenchyma. Gallbladder is distended with gallbladder wall thickening/edema. Multiple very tiny 1-2 mm stones are seen dependently in the lumen of gallbladder. There is extensive edema/inflammation in the gallbladder fossa extending down under the liver to involve the hepatic flexure of the colon. Edema/inflammation tracks in the hepatoduodenal ligament and retroperitoneal spaces to the descending duodenum. Trace fluid is seen at the inferior tip of the right liver. No intra or extrahepatic biliary duct dilatation. Common bile duct diameter is upper normal at 6 mm in the head of the pancreas. MRCP imaging shows no choledocholithiasis. Pancreas: No focal mass lesion. No dilatation of the main duct. No intraparenchymal cyst. No peripancreatic edema. Spleen:  No  splenomegaly. No suspicious focal mass lesion. Adrenals/Urinary Tract: No adrenal nodule or mass. Tiny well-defined homogeneous T2 hyperintensities are seen in both kidneys, incompletely characterized on postcontrast imaging most compatible with simple cyst. No followup imaging is recommended. Stomach/Bowel: Stomach is unremarkable. No gastric wall thickening. No evidence of outlet obstruction. Duodenum is normally positioned as is the ligament of Treitz. No small bowel or colonic dilatation within the visualized abdomen. Vascular/Lymphatic: No abdominal aortic aneurysm. No abdominal aortic atherosclerotic calcification. There is no gastrohepatic or hepatoduodenal ligament lymphadenopathy. No retroperitoneal or mesenteric lymphadenopathy. Other: Trace free fluid in the right upper quadrant as described above. Musculoskeletal: No focal suspicious marrow enhancement within the visualized bony anatomy. IMPRESSION: 1. Cholelithiasis with gallbladder wall thickening/edema and extensive edema/inflammation in the gallbladder fossa extending down under the liver to involve the hepatic flexure of the colon. Edema/inflammation tracks in the hepatoduodenal ligament and retroperitoneal space to the descending duodenum. Imaging features are compatible with acute cholecystitis. 2. No intra or extrahepatic biliary duct dilatation. Common bile  duct diameter is upper normal at 6 mm in the head of the pancreas. MRCP imaging shows no choledocholithiasis. Electronically Signed   By: Kennith Center M.D.   On: 09/06/2022 07:00   US Abdomen Limited RUQ (LIVER/GB)  Result Date: 09/05/2022 CLINICAL DATA:  Abdominal pain for 3 days. EXAM: ULTRASOUND ABDOMEN LIMITED RIGHT UPPER QUADRANT COMPARISON:  CT AP from earlier today FINDINGS: Gallbladder: Gallbladder appears distended and is filled with sludge. Gallbladder wall thickness measures up to 6 mm. No stones identified. A negative sonographic Murphy's sign was reported by the sonographer.  Common bile duct: Diameter: 5.0 mm Liver: Increased parenchymal echogenicity. No focal liver abnormality. Portal vein is patent on color Doppler imaging with normal direction of blood flow towards the liver. Other: Exam detail is diminished secondary to overlying bowel gas and patient's body habitus. IMPRESSION: 1. Diminished exam detail due to body habitus and overlying bowel gas. 2. The gallbladder is distended and filled with sludge. There is gallbladder wall thickening measuring up to 6 mm. No stones identified. Negative sonographic Murphy's sign. 3. Despite absence of gallstones and positive sonographic Murphy's sign there is considerable inflammatory change noted within the right upper quadrant surrounding hepatic flexure and gallbladder on the CT from earlier today. Those findings remain suspicious for either acute cholecystitis or segmental colitis with secondary inflammation of the gallbladder. 4. Increased echogenicity of the liver parenchyma compatible with hepatic steatosis. Electronically Signed   By: Signa Kell M.D.   On: 09/05/2022 11:16   CT Renal Stone Study  Result Date: 09/05/2022 CLINICAL DATA:  Abdominal/flank pain.  Evaluate for kidney stone. EXAM: CT ABDOMEN AND PELVIS WITHOUT CONTRAST TECHNIQUE: Multidetector CT imaging of the abdomen and pelvis was performed following the standard protocol without IV contrast. RADIATION DOSE REDUCTION: This exam was performed according to the departmental dose-optimization program which includes automated exposure control, adjustment of the mA and/or kV according to patient size and/or use of iterative reconstruction technique. COMPARISON:  08/21/2018 FINDINGS: Lower chest: Bilateral pleural thickening and lower lobe subsegmental atelectasis is identified. No pleural fluid or airspace consolidation. Hepatobiliary: No suspicious liver lesion identified. There are inflammatory changes surrounding the gallbladder with possible gallbladder wall  thickening (difficult to assess reflecting lack of IV contrast). Focal area of low density adjacent to the gallbladder fossa within segment 4 B is identified and is favored to represent focal fatty deposition, image 23/2. Fat stranding in is also noted extending around the hepatic flexure which exhibits bowel wall edema. No calcified gallstones identified. No signs of bile duct dilatation. Pancreas: Unremarkable. No pancreatic ductal dilatation or surrounding inflammatory changes. Spleen: Normal in size without focal abnormality. Adrenals/Urinary Tract: Normal adrenal glands. Bilateral nephrolithiasis. The largest stone is identified within the right renal pelvis measuring 1.3 by 0.7 cm, image 48/2. There is left hydronephrosis and hydroureter. Stone within the distal ureter measures 5 mm, image 83/2 no right-sided hydronephrosis or hydroureter. Bladder is normal. Stomach/Bowel: Stomach is within normal limits. The appendix is visualized and is normal. Short segment of wall thickening with surrounding inflammation involves the hepatic flexure adjacent to the gallbladder. Vascular/Lymphatic: Aortic atherosclerosis. No abdominopelvic adenopathy. Reproductive: Prostate is unremarkable. Other: There is a small amount of fluid along the peritoneal reflection at the level of the left iliac fossa, image 78/2. No focal fluid collections identified to suggest an abscess. Musculoskeletal: No acute or significant osseous findings. No acute or suspicious osseous findings. IMPRESSION: 1. Bilateral nephrolithiasis. Left-sided hydronephrosis and hydroureter secondary to 5 mm distal left ureteral calculus.  2. There are inflammatory changes surrounding the gallbladder. No calcified gallstones identified. If there is a clinical concern for acute cholecystitis consider further evaluation with right upper quadrant ultrasound. 3. Short segment of wall thickening with surrounding inflammation involves the hepatic flexure adjacent to the  gallbladder. This is favored to represent secondary inflammation of the colon. Less favored would be acute diverticulitis or short segment colitis with secondary inflammation of the gallbladder. 4.  Aortic Atherosclerosis (ICD10-I70.0). Electronically Signed   By: Signa Kell M.D.   On: 09/05/2022 09:33    Labs:  CBC: Recent Labs    09/05/22 0601 09/06/22 0338  WBC 15.3* 22.6*  HGB 13.3 14.4  HCT 40.1 44.3  PLT 160 183    COAGS: No results for input(s): "INR", "APTT" in the last 8760 hours.  BMP: Recent Labs    09/05/22 0601 09/06/22 0338  NA 137 137  K 4.0 4.5  CL 105 104  CO2 22 23  GLUCOSE 135* 130*  BUN 27* 27*  CALCIUM 9.0 8.5*  CREATININE 1.87* 1.99*  GFRNONAA 38* 35*    LIVER FUNCTION TESTS: Recent Labs    09/05/22 0601 09/06/22 0338  BILITOT 1.0 1.1  AST 28 33  ALT 27 28  ALKPHOS 54 56  PROT 7.3 7.0  ALBUMIN 3.8 3.3*    TUMOR MARKERS: No results for input(s): "AFPTM", "CEA", "CA199", "CHROMGRNA" in the last 8760 hours.  Assessment and Plan: 72 y.o. male with acute cholecystitis  who is in need of perc chole.   No solid food since Friday night, only has sips of water today.  CBC with worsening leukocytosis, 22.6; hgb 14.1 plt 183  CMP with worsening RF BUN 27, creatinine 1.99, GFR 35 STAT INR pending at 0909 hrs - lab has been contacted by RN On ASA 81 mg and DVT prophylaxis Lovenox 40 mg qd, last ASA taken on 6/21,  Lovenox last given at 2104 hrs yesterday  Not on abx after one time does of Zosyn at 1159 hrs yesterday, 2 g cefoxitin will be given during the placement - ordered, IR team notified.   Risks and benefits discussed with the patient including, but not limited to bleeding, infection, gallbladder perforation, bile leak, sepsis or even death.  All of the patient's questions were answered, patient is agreeable to proceed. Consent signed and in chart.   Thank you for this interesting consult.  I greatly enjoyed meeting ABRAN GAVIGAN  and look forward to participating in their care.  A copy of this report was sent to the requesting provider on this date.  Electronically Signed: Willette Brace, PA-C 09/06/2022, 9:07 AM   I spent a total of 40 Minutes    in face to face in clinical consultation, greater than 50% of which was counseling/coordinating care for perc chole placement.   This chart was dictated using voice recognition software.  Despite best efforts to proofread,  errors can occur which can change the documentation meaning.

## 2022-09-07 ENCOUNTER — Inpatient Hospital Stay (HOSPITAL_COMMUNITY): Payer: Medicare Other

## 2022-09-07 DIAGNOSIS — K810 Acute cholecystitis: Secondary | ICD-10-CM | POA: Diagnosis not present

## 2022-09-07 DIAGNOSIS — R109 Unspecified abdominal pain: Secondary | ICD-10-CM | POA: Diagnosis not present

## 2022-09-07 LAB — CBC
HCT: 38.8 % — ABNORMAL LOW (ref 39.0–52.0)
Hemoglobin: 12.2 g/dL — ABNORMAL LOW (ref 13.0–17.0)
MCH: 30.7 pg (ref 26.0–34.0)
MCHC: 31.4 g/dL (ref 30.0–36.0)
MCV: 97.7 fL (ref 80.0–100.0)
Platelets: 169 10*3/uL (ref 150–400)
RBC: 3.97 MIL/uL — ABNORMAL LOW (ref 4.22–5.81)
RDW: 13.3 % (ref 11.5–15.5)
WBC: 16.4 10*3/uL — ABNORMAL HIGH (ref 4.0–10.5)
nRBC: 0 % (ref 0.0–0.2)

## 2022-09-07 LAB — COMPREHENSIVE METABOLIC PANEL
ALT: 22 U/L (ref 0–44)
AST: 24 U/L (ref 15–41)
Albumin: 2.9 g/dL — ABNORMAL LOW (ref 3.5–5.0)
Alkaline Phosphatase: 48 U/L (ref 38–126)
Anion gap: 10 (ref 5–15)
BUN: 40 mg/dL — ABNORMAL HIGH (ref 8–23)
CO2: 22 mmol/L (ref 22–32)
Calcium: 8.1 mg/dL — ABNORMAL LOW (ref 8.9–10.3)
Chloride: 106 mmol/L (ref 98–111)
Creatinine, Ser: 2.01 mg/dL — ABNORMAL HIGH (ref 0.61–1.24)
GFR, Estimated: 35 mL/min — ABNORMAL LOW (ref >60)
Glucose, Bld: 117 mg/dL — ABNORMAL HIGH (ref 70–99)
Potassium: 4.2 mmol/L (ref 3.5–5.1)
Sodium: 138 mmol/L (ref 135–145)
Total Bilirubin: 1.2 mg/dL (ref 0.3–1.2)
Total Protein: 6.2 g/dL — ABNORMAL LOW (ref 6.5–8.1)

## 2022-09-07 MED ORDER — LOPERAMIDE HCL 2 MG PO CAPS
2.0000 mg | ORAL_CAPSULE | ORAL | Status: DC | PRN
  Administered 2022-09-07 (×2): 2 mg via ORAL
  Filled 2022-09-07 (×2): qty 1

## 2022-09-07 NOTE — Progress Notes (Signed)
[facility]  PROGRESS NOTE  Frank Gilmore:[id number] DOB: 18-Dec-1950 DOA: 09/05/2022 PCP: Benita Stabile, MD   Brief HPI:   72 year old male with medical history of prior kidney stones, hypertension, hyperlipidemia came to [facility] ER with right-sided flank pain right upper quadrant pain.  Patient reports passing 4 kidney stone in the past 2 to 3 days then he began to feel better.  However 2 days ago he started having right upper quadrant pain.  In the ED CT scan showed bilateral nephrolithiasis, left-sided hydronephrosis and hydroureter secondary to 5 mm distal left ureteral calculus.  There was also inflammatory changes at the gallbladder, right upper quadrant ultrasound showed significant inflammation of the gallbladder consistent with cholecystitis.  Also bowel segment with wall thickening around the hepatic flexure suspected to be secondary to gallbladder inflammation.  General surgery was consulted, recommended medical admission and consult with urology.  MRI abdomen was ordered.    Assessment/Plan:   Cholecystitis -Seen on CT abdomen/pelvis, -MRI abdomen showed extensive edema inflammation of the gallbladder with significant extension to surrounding bowel, no evidence of stone in the common bile duct -General surgery recommended cholecystostomy tube and delay cholecystectomy -IR was consulted for cholecystostomy tube placement; underwent tube placement today -WBC is down today to  16,000 -Will continue with Zosyn per pharmacy -Will discharge on Augmentin when ready for discharge   Nephrolithiasis with left hydronephrosis -5 mm distal left ureteral calculus -Urology was consulted, recommended IV fluids and tamsulosin to help with passing stone  Hypertension -Blood pressure is stable -Continue hydralazine as needed -HCTZ and Cozaar on hold due to AKI  Acute kidney injury -Presented with creatinine of 1.87, increased to 2.01 -Continue normal saline at 100 mL/h -Follow serum  creatinine in a.m. -Avoid nephrotoxins -CT renal stone study obtained today showed distal left ureteral stone measuring 5 mm causing hydronephrosis.  There has been some distal migration when compared to prior exam. -Follow urology recommendations  Diarrhea -Likely antibiotic induced -Start Imodium as needed  Medications     enoxaparin (LOVENOX) injection  40 mg Subcutaneous Q24H   tamsulosin  0.4 mg Oral Daily     Data Reviewed:   CBG:  No results for input(s): "GLUCAP" in the last 168 hours.  SpO2: 93 % O2 Flow Rate (L/min): 2 L/min    Vitals:   09/06/22 1105 09/06/22 1139 09/06/22 1227 09/06/22 1909  BP: 125/77 115/69 114/72   Pulse: 96 93 89   Resp: 18 18 18    Temp: 98.2 F (36.8 C) 98.4 F (36.9 C) 98 F (36.7 C) 98.3 F (36.8 C)  TempSrc: Oral Oral Oral Oral  SpO2: (!) 89% (!) 89% 93%   Weight:      Height:          Data Reviewed:  Basic Metabolic Panel: Recent Labs  Lab 09/05/22 0601 09/06/22 0338 09/07/22 0350  NA 137 137 138  K 4.0 4.5 4.2  CL 105 104 106  CO2 22 23 22   GLUCOSE 135* 130* 117*  BUN 27* 27* 40*  CREATININE 1.87* 1.99* 2.01*  CALCIUM 9.0 8.5* 8.1*    CBC: Recent Labs  Lab 09/05/22 0601 09/06/22 0338 09/07/22 0350  WBC 15.3* 22.6* 16.4*  NEUTROABS 11.6*  --   --   HGB 13.3 14.4 12.2*  HCT 40.1 44.3 38.8*  MCV 94.4 95.7 97.7  PLT 160 183 169    LFT Recent Labs  Lab 09/05/22 0601 09/06/22 0338 09/07/22 0350  AST 28 33 24  ALT  27 28 22   ALKPHOS 54 56 48  BILITOT 1.0 1.1 1.2  PROT 7.3 7.0 6.2*  ALBUMIN 3.8 3.3* 2.9*     Antibiotics: Anti-infectives (From admission, onward)    Start     Dose/Rate Route Frequency Ordered Stop   09/06/22 1515  piperacillin-tazobactam (ZOSYN) IVPB 3.375 g        3.375 g 12.5 mL/hr over 240 Minutes Intravenous Every 8 hours 09/06/22 1417     09/06/22 1000  cefOXitin (MEFOXIN) 2 g in sodium chloride 0.9 % 100 mL IVPB       Note to Pharmacy: To Interventional Radiology.   2  g 200 mL/hr over 30 Minutes Intravenous To Radiology 09/06/22 0911 09/07/22 1000   09/05/22 1200  piperacillin-tazobactam (ZOSYN) IVPB 3.375 g        3.375 g 100 mL/hr over 30 Minutes Intravenous  Once 09/05/22 1147 09/05/22 1230        DVT prophylaxis: Lovenox  Code Status: Full code  Family Communication: Discussed with patient's wife at bedside  CONSULTS    Subjective   Patient seen and examined, denies pain.   Objective    Physical Examination:  General-appears in no acute distress Heart-S1-S2, regular, no murmur auscultated Lungs-clear to auscultation bilaterally, no wheezing or crackles auscultated Abdomen-soft, nontender, no organomegaly, cholecystostomy tube in place Extremities-no edema in the lower extremities Neuro-alert, oriented x3, no focal deficit noted  Status is: Inpatient:             Meredeth Ide   [facility] If 7PM-7AM, please contact night-coverage at [email or web address], Office  [phone]   09/07/2022, 9:18 AM  LOS: 2 days

## 2022-09-07 NOTE — Progress Notes (Signed)
Trauma/Critical Care Follow Up Note  Subjective:    Overnight Issues:   Objective:  Vital signs for last 24 hours: Temp:  [98 F (36.7 C)-98.4 F (36.9 C)] 98.3 F (36.8 C) (06/23 1909) Pulse Rate:  [89-110] 89 (06/23 1227) Resp:  [11-20] 18 (06/23 1227) BP: (114-151)/(69-91) 114/72 (06/23 1227) SpO2:  [89 %-95 %] 93 % (06/23 1227)  Hemodynamic parameters for last 24 hours:    Intake/Output from previous day: 06/23 0701 - 06/24 0700 In: 1047.6 [I.V.:1035.7; IV Piggyback:11.9] Out: 1020 [Urine:700; Drains:320]  Intake/Output this shift: Total I/O In: -  Out: 400 [Urine:400]  Vent settings for last 24 hours:    Physical Exam:  Gen: comfortable, no distress Neuro: follows commands, alert, communicative HEENT: PERRL Neck: supple CV: RRR Pulm: unlabored breathing on RA Abd: soft, NT  , JP bilious GU: urine clear and yellow, +spontaneous voids Extr: wwp, no edema  Results for orders placed or performed during the hospital encounter of 09/05/22 (from the past 24 hour(s))  Aerobic/Anaerobic Culture w Gram Stain (surgical/deep wound)     Status: None (Preliminary result)   Collection Time: 09/06/22 10:20 AM   Specimen: BILE  Result Value Ref Range   Specimen Description      BILE Performed at [facility], [address] [place], [place] [address]    Special Requests      Normal Performed at [facility], [address], [place], [place] [address]    Gram Stain NO WBC SEEN NO ORGANISMS SEEN     Culture      NO GROWTH < 24 HOURS Performed at [facility], [address] [place], [place] [address]    Report Status PENDING   Protime-INR     Status: Abnormal   Collection Time: 09/06/22 12:08 PM  Result Value Ref Range   Prothrombin Time 17.2 (H) 11.4 - 15.2 seconds   INR 1.4 (H) 0.8 - 1.2  CBC     Status: Abnormal   Collection Time: 09/07/22  3:50 AM  Result Value Ref Range   WBC 16.4 (H) 4.0 - 10.5 K/uL    RBC 3.97 (L) 4.22 - 5.81 MIL/uL   Hemoglobin 12.2 (L) 13.0 - 17.0 g/dL   HCT 46.9 (L) 62.9 - 52.8 %   MCV 97.7 80.0 - 100.0 fL   MCH 30.7 26.0 - 34.0 pg   MCHC 31.4 30.0 - 36.0 g/dL   RDW 41.3 24.4 - 01.0 %   Platelets 169 150 - 400 K/uL   nRBC 0.0 0.0 - 0.2 %  Comprehensive metabolic panel     Status: Abnormal   Collection Time: 09/07/22  3:50 AM  Result Value Ref Range   Sodium 138 135 - 145 mmol/L   Potassium 4.2 3.5 - 5.1 mmol/L   Chloride 106 98 - 111 mmol/L   CO2 22 22 - 32 mmol/L   Glucose, Bld 117 (H) 70 - 99 mg/dL   BUN 40 (H) 8 - 23 mg/dL   Creatinine, Ser 2.72 (H) 0.61 - 1.24 mg/dL   Calcium 8.1 (L) 8.9 - 10.3 mg/dL   Total Protein 6.2 (L) 6.5 - 8.1 g/dL   Albumin 2.9 (L) 3.5 - 5.0 g/dL   AST 24 15 - 41 U/L   ALT 22 0 - 44 U/L   Alkaline Phosphatase 48 38 - 126 U/L   Total Bilirubin 1.2 0.3 - 1.2 mg/dL   GFR, Estimated 35 (L) >60 mL/min   Anion  gap 10 5 - 15    Assessment & Plan:  Present on Admission:  Acute cholecystitis    LOS: 2 days   Additional comments:I reviewed the patient's new clinical lab test results.   and I reviewed the patients new imaging test results.     Severe acute cholecystitis with cholelithiasis - s/p perc chole 6/23, micro pending, NGTD, okay for PO challenge, f/u as o/p with Dr. Magnus Ivan.  Left ureteral stone - CT pending, defer to Urology FEN - regular diet if okay with primary/Urology  DVT - SCDs, LMWH Dispo - med-surg    Diamantina Monks, MD Trauma & General Surgery Please use [email or web address] to contact on call provider  09/07/2022  *Care during the described time interval was provided by me. I have reviewed this patient's available data, including medical history, events of note, physical examination and test results as part of my evaluation.

## 2022-09-07 NOTE — Progress Notes (Signed)
Subjective: Patient with no flank pain.  Continue to have some abdominal pain.  MRI suggested severe cholecystitis and is undergoing percutaneous cholecystostomy drainage today.  Serum creatinine 1.99 slightly worse but may be related to ongoing gallbladder issues. Vital signs in last 24 hours: Temp:  [98 F (36.7 C)-98.4 F (36.9 C)] 98.3 F (36.8 C) (06/23 1909) Pulse Rate:  [89-110] 89 (06/23 1227) Resp:  [11-20] 18 (06/23 1227) BP: (114-151)/(69-91) 114/72 (06/23 1227) SpO2:  [89 %-95 %] 93 % (06/23 1227)  Intake/Output from previous day: 06/23 0701 - 06/24 0700 In: 1047.6 [I.V.:1035.7; IV Piggyback:11.9] Out: 1020 [Urine:700; Drains:320] Intake/Output this shift: No intake/output data recorded.  Physical Exam:  General: Alert and oriented C Lab Results: Recent Labs    09/05/22 0601 09/06/22 0338 09/07/22 0350  HGB 13.3 14.4 12.2*  HCT 40.1 44.3 38.8*   BMET Recent Labs    09/06/22 0338 09/07/22 0350  NA 137 138  K 4.5 4.2  CL 104 106  CO2 23 22  GLUCOSE 130* 117*  BUN 27* 40*  CREATININE 1.99* 2.01*  CALCIUM 8.5* 8.1*     Studies/Results: IR Perc Cholecystostomy  Result Date: 09/06/2022 INDICATION: 72 year old gentleman with severe calculus cholecystitis including significant pericholecystic inflammation. He is a poor candidate for surgery at this time. Therefore, he presents for placement of a percutaneous cholecystostomy tube. EXAM: CHOLECYSTOSTOMY MEDICATIONS: In patient currently receiving intravenous antibiotics. No additional antibiotic prophylaxis was administered. ANESTHESIA/SEDATION: Moderate (conscious) sedation was employed during this procedure. A total of Versed 1 mg and Dilaudid 1.5 mg was administered intravenously. Moderate Sedation Time: 10 minutes. The patient's level of consciousness and vital signs were monitored continuously by radiology nursing throughout the procedure under my direct supervision. FLUOROSCOPY TIME:  Radiation exposure  index: 9 mGy reference air kerma COMPLICATIONS: None immediate. PROCEDURE: Informed written consent was obtained from the patient after a thorough discussion of the procedural risks, benefits and alternatives. All questions were addressed. Maximal Sterile Barrier Technique was utilized including caps, mask, sterile gowns, sterile gloves, sterile drape, hand hygiene and skin antiseptic. A timeout was performed prior to the initiation of the procedure. The right upper quadrant was interrogated with ultrasound. The distended gallbladder was successfully identified. Local anesthesia was attained by infiltration with 1% lidocaine. A small dermatotomy was made. Under real-time ultrasound guidance, a 21 gauge Accustick needle was advanced along a trans peritoneal course and into the fundus of the gallbladder. Needle was then directed toward the gallbladder neck. A 0.018 wire was coiled in the gallbladder lumen. The needle was exchanged for the Accustick transitional dilator which was advanced over the wire and into the gallbladder lumen. The wire was then exchanged for a 0.035 Amplatz wire. The trans cutaneous tract was dilated to 10 [place]. A Cook 10 [place] all-purpose drainage catheter was then advanced over the wire and formed in the gallbladder lumen. Aspiration yields 20 mL of thick black bile and debris. This was sent for Gram stain and culture. A gentle hand injection of contrast material was then performed confirming tube location within the gallbladder lumen. Images were obtained and stored for the medical record. The drain was connected to gravity bag drainage and secured to the skin with 0 Prolene suture. Sterile bandages were placed. IMPRESSION: Successful placement of trans peritoneal 10 French percutaneous cholecystostomy tube for the treatment of severe acute calculus cholecystitis. Electronically Signed   By: Malachy Moan M.D.   On: 09/06/2022 11:31   MR ABDOMEN WITH MRCP W CONTRAST  Result Date:  09/06/2022 CLINICAL DATA:  Cholecystitis. EXAM: MRI ABDOMEN WITH CONTRAST (WITH MRCP) TECHNIQUE: Multiplanar multisequence MR imaging of the abdomen was performed following the administration of intravenous contrast. Heavily T2-weighted images of the biliary and pancreatic ducts were obtained, and three-dimensional MRCP images were rendered by post processing. CONTRAST:  10mL GADAVIST GADOBUTROL 1 MMOL/ML IV SOLN COMPARISON:  Ultrasound and CT exams from earlier the same day FINDINGS: Lower chest: Dependent atelectasis. Hepatobiliary: No suspicious focal abnormality within the liver parenchyma. Gallbladder is distended with gallbladder wall thickening/edema. Multiple very tiny 1-2 mm stones are seen dependently in the lumen of gallbladder. There is extensive edema/inflammation in the gallbladder fossa extending down under the liver to involve the hepatic flexure of the colon. Edema/inflammation tracks in the hepatoduodenal ligament and retroperitoneal spaces to the descending duodenum. Trace fluid is seen at the inferior tip of the right liver. No intra or extrahepatic biliary duct dilatation. Common bile duct diameter is upper normal at 6 mm in the head of the pancreas. MRCP imaging shows no choledocholithiasis. Pancreas: No focal mass lesion. No dilatation of the main duct. No intraparenchymal cyst. No peripancreatic edema. Spleen:  No splenomegaly. No suspicious focal mass lesion. Adrenals/Urinary Tract: No adrenal nodule or mass. Tiny well-defined homogeneous T2 hyperintensities are seen in both kidneys, incompletely characterized on postcontrast imaging most compatible with simple cyst. No followup imaging is recommended. Stomach/Bowel: Stomach is unremarkable. No gastric wall thickening. No evidence of outlet obstruction. Duodenum is normally positioned as is the ligament of Treitz. No small bowel or colonic dilatation within the visualized abdomen. Vascular/Lymphatic: No abdominal aortic aneurysm. No  abdominal aortic atherosclerotic calcification. There is no gastrohepatic or hepatoduodenal ligament lymphadenopathy. No retroperitoneal or mesenteric lymphadenopathy. Other: Trace free fluid in the right upper quadrant as described above. Musculoskeletal: No focal suspicious marrow enhancement within the visualized bony anatomy. IMPRESSION: 1. Cholelithiasis with gallbladder wall thickening/edema and extensive edema/inflammation in the gallbladder fossa extending down under the liver to involve the hepatic flexure of the colon. Edema/inflammation tracks in the hepatoduodenal ligament and retroperitoneal space to the descending duodenum. Imaging features are compatible with acute cholecystitis. 2. No intra or extrahepatic biliary duct dilatation. Common bile duct diameter is upper normal at 6 mm in the head of the pancreas. MRCP imaging shows no choledocholithiasis. Electronically Signed   By: Kennith Center M.D.   On: 09/06/2022 07:00   MR 3D Recon At Scanner  Result Date: 09/06/2022 CLINICAL DATA:  Cholecystitis. EXAM: MRI ABDOMEN WITH CONTRAST (WITH MRCP) TECHNIQUE: Multiplanar multisequence MR imaging of the abdomen was performed following the administration of intravenous contrast. Heavily T2-weighted images of the biliary and pancreatic ducts were obtained, and three-dimensional MRCP images were rendered by post processing. CONTRAST:  10mL GADAVIST GADOBUTROL 1 MMOL/ML IV SOLN COMPARISON:  Ultrasound and CT exams from earlier the same day FINDINGS: Lower chest: Dependent atelectasis. Hepatobiliary: No suspicious focal abnormality within the liver parenchyma. Gallbladder is distended with gallbladder wall thickening/edema. Multiple very tiny 1-2 mm stones are seen dependently in the lumen of gallbladder. There is extensive edema/inflammation in the gallbladder fossa extending down under the liver to involve the hepatic flexure of the colon. Edema/inflammation tracks in the hepatoduodenal ligament and  retroperitoneal spaces to the descending duodenum. Trace fluid is seen at the inferior tip of the right liver. No intra or extrahepatic biliary duct dilatation. Common bile duct diameter is upper normal at 6 mm in the head of the pancreas. MRCP imaging shows no choledocholithiasis. Pancreas: No focal mass lesion. No dilatation  of the main duct. No intraparenchymal cyst. No peripancreatic edema. Spleen:  No splenomegaly. No suspicious focal mass lesion. Adrenals/Urinary Tract: No adrenal nodule or mass. Tiny well-defined homogeneous T2 hyperintensities are seen in both kidneys, incompletely characterized on postcontrast imaging most compatible with simple cyst. No followup imaging is recommended. Stomach/Bowel: Stomach is unremarkable. No gastric wall thickening. No evidence of outlet obstruction. Duodenum is normally positioned as is the ligament of Treitz. No small bowel or colonic dilatation within the visualized abdomen. Vascular/Lymphatic: No abdominal aortic aneurysm. No abdominal aortic atherosclerotic calcification. There is no gastrohepatic or hepatoduodenal ligament lymphadenopathy. No retroperitoneal or mesenteric lymphadenopathy. Other: Trace free fluid in the right upper quadrant as described above. Musculoskeletal: No focal suspicious marrow enhancement within the visualized bony anatomy. IMPRESSION: 1. Cholelithiasis with gallbladder wall thickening/edema and extensive edema/inflammation in the gallbladder fossa extending down under the liver to involve the hepatic flexure of the colon. Edema/inflammation tracks in the hepatoduodenal ligament and retroperitoneal space to the descending duodenum. Imaging features are compatible with acute cholecystitis. 2. No intra or extrahepatic biliary duct dilatation. Common bile duct diameter is upper normal at 6 mm in the head of the pancreas. MRCP imaging shows no choledocholithiasis. Electronically Signed   By: Kennith Center M.D.   On: 09/06/2022 07:00   US  Abdomen Limited RUQ (LIVER/GB)  Result Date: 09/05/2022 CLINICAL DATA:  Abdominal pain for 3 days. EXAM: ULTRASOUND ABDOMEN LIMITED RIGHT UPPER QUADRANT COMPARISON:  CT AP from earlier today FINDINGS: Gallbladder: Gallbladder appears distended and is filled with sludge. Gallbladder wall thickness measures up to 6 mm. No stones identified. A negative sonographic Murphy's sign was reported by the sonographer. Common bile duct: Diameter: 5.0 mm Liver: Increased parenchymal echogenicity. No focal liver abnormality. Portal vein is patent on color Doppler imaging with normal direction of blood flow towards the liver. Other: Exam detail is diminished secondary to overlying bowel gas and patient's body habitus. IMPRESSION: 1. Diminished exam detail due to body habitus and overlying bowel gas. 2. The gallbladder is distended and filled with sludge. There is gallbladder wall thickening measuring up to 6 mm. No stones identified. Negative sonographic Murphy's sign. 3. Despite absence of gallstones and positive sonographic Murphy's sign there is considerable inflammatory change noted within the right upper quadrant surrounding hepatic flexure and gallbladder on the CT from earlier today. Those findings remain suspicious for either acute cholecystitis or segmental colitis with secondary inflammation of the gallbladder. 4. Increased echogenicity of the liver parenchyma compatible with hepatic steatosis. Electronically Signed   By: Signa Kell M.D.   On: 09/05/2022 11:16   CT Renal Stone Study  Result Date: 09/05/2022 CLINICAL DATA:  Abdominal/flank pain.  Evaluate for kidney stone. EXAM: CT ABDOMEN AND PELVIS WITHOUT CONTRAST TECHNIQUE: Multidetector CT imaging of the abdomen and pelvis was performed following the standard protocol without IV contrast. RADIATION DOSE REDUCTION: This exam was performed according to the departmental dose-optimization program which includes automated exposure control, adjustment of the mA  and/or kV according to patient size and/or use of iterative reconstruction technique. COMPARISON:  08/21/2018 FINDINGS: Lower chest: Bilateral pleural thickening and lower lobe subsegmental atelectasis is identified. No pleural fluid or airspace consolidation. Hepatobiliary: No suspicious liver lesion identified. There are inflammatory changes surrounding the gallbladder with possible gallbladder wall thickening (difficult to assess reflecting lack of IV contrast). Focal area of low density adjacent to the gallbladder fossa within segment 4 B is identified and is favored to represent focal fatty deposition, image 23/2. Fat stranding in  is also noted extending around the hepatic flexure which exhibits bowel wall edema. No calcified gallstones identified. No signs of bile duct dilatation. Pancreas: Unremarkable. No pancreatic ductal dilatation or surrounding inflammatory changes. Spleen: Normal in size without focal abnormality. Adrenals/Urinary Tract: Normal adrenal glands. Bilateral nephrolithiasis. The largest stone is identified within the right renal pelvis measuring 1.3 by 0.7 cm, image 48/2. There is left hydronephrosis and hydroureter. Stone within the distal ureter measures 5 mm, image 83/2 no right-sided hydronephrosis or hydroureter. Bladder is normal. Stomach/Bowel: Stomach is within normal limits. The appendix is visualized and is normal. Short segment of wall thickening with surrounding inflammation involves the hepatic flexure adjacent to the gallbladder. Vascular/Lymphatic: Aortic atherosclerosis. No abdominopelvic adenopathy. Reproductive: Prostate is unremarkable. Other: There is a small amount of fluid along the peritoneal reflection at the level of the left iliac fossa, image 78/2. No focal fluid collections identified to suggest an abscess. Musculoskeletal: No acute or significant osseous findings. No acute or suspicious osseous findings. IMPRESSION: 1. Bilateral nephrolithiasis. Left-sided  hydronephrosis and hydroureter secondary to 5 mm distal left ureteral calculus. 2. There are inflammatory changes surrounding the gallbladder. No calcified gallstones identified. If there is a clinical concern for acute cholecystitis consider further evaluation with right upper quadrant ultrasound. 3. Short segment of wall thickening with surrounding inflammation involves the hepatic flexure adjacent to the gallbladder. This is favored to represent secondary inflammation of the colon. Less favored would be acute diverticulitis or short segment colitis with secondary inflammation of the gallbladder. 4.  Aortic Atherosclerosis (ICD10-I70.0). Electronically Signed   By: Signa Kell M.D.   On: 09/05/2022 09:33    Assessment/Plan: #Urolithiosis 5mm distal left stone. Medical expulsive therapy. No pain on rounds.  Stable but elevated Scr. 2.01 today. Collecting CT stone to further characterize AKI.  Concentrated urine. Zosyn has been running in lieu of the ordered IV hydration. Discussed with nursing and requested these run concurrently.   #Acute cholecystitis  now requiring percutaneous cholecystostomy drain     LOS: 2 days   Scherrie Bateman Treysen Sudbeck 09/07/2022, 8:28 AM

## 2022-09-07 NOTE — Progress Notes (Signed)
Referring Physician(s): Abigail Miyamoto, MD  Supervising Physician: Roanna Banning  Patient Status:  [facility] - In-pt  Chief Complaint:  Acute cholecystitis s/p percutaneous cholecystostomy drain placement 09/06/22  Subjective:  Patient is sitting comfortably in bed with family at bedside at time of exam. He reports that he is feeling much better today following his perc chole placement.   Allergies: Patient has no known allergies.  Medications: Prior to Admission medications   Medication Sig Start Date End Date Taking? Authorizing Provider  aspirin EC 81 MG tablet Take 81 mg by mouth daily.     Yes [provider]  hydrochlorothiazide (HYDRODIURIL) 12.5 MG tablet Take 12.5 mg by mouth daily. 07/01/18  Yes [provider]  losartan (COZAAR) 100 MG tablet Take 100 mg by mouth daily.     Yes [provider]  Multiple Vitamins-Minerals (MULTIVITAMINS THER. W/MINERALS) TABS Take 1 tablet by mouth daily.     Yes [provider]  naproxen sodium (ANAPROX) 220 MG tablet Take 220 mg by mouth every 8 (eight) hours as needed. For pain    Yes [provider]  pravastatin (PRAVACHOL) 40 MG tablet Take 1 tablet by mouth daily. 08/27/22  Yes [provider]     Vital Signs: BP 133/84 (BP Location: Left Arm)   Pulse 82   Temp 98.8 F (37.1 C) (Oral)   Resp (!) 24   Ht 5\' 8"  (1.727 m)   Wt 220 lb (99.8 kg)   SpO2 95%   BMI 33.45 kg/m   Physical Exam  Drain Location: RUQ Size: Fr size: 10 Fr Date of placement: 09/06/22  Currently to: Drain collection device: gravity 24 hour output:  Output by Drain (mL) 09/05/22 0701 - 09/05/22 1900 09/05/22 1901 - 09/06/22 0700 09/06/22 0701 - 09/06/22 1900 09/06/22 1901 - 09/07/22 0700 09/07/22 0701 - 09/07/22 1435  Biliary Tube Cook slip-coat 10 Fr. RUQ   120 200 75    Interval imaging/drain manipulation:  None  Current examination: Flushes/aspirates easily.  Insertion site  unremarkable. Suture and stat lock in place. Dressed appropriately.  Small amount of dark green bilious fluid in gravity bag at time of exam.   Imaging: CT RENAL STONE STUDY  Result Date: 09/07/2022 CLINICAL DATA:  Flank pain, evaluate for renal calculi EXAM: CT ABDOMEN AND PELVIS WITHOUT CONTRAST TECHNIQUE: Multidetector CT imaging of the abdomen and pelvis was performed following the standard protocol without IV contrast. RADIATION DOSE REDUCTION: This exam was performed according to the departmental dose-optimization program which includes automated exposure control, adjustment of the mA and/or kV according to patient size and/or use of iterative reconstruction technique. COMPARISON:  09/05/2022 FINDINGS: Lower chest: Right basilar atelectasis is noted with small effusion. Mild left basilar atelectasis is seen as well. Hepatobiliary: Cholecystostomy tube is noted in place. Liver appears within normal limits. Mild inflammatory changes are noted surrounding the gallbladder. Pancreas: Unremarkable. No pancreatic ductal dilatation or surrounding inflammatory changes. Spleen: Normal in size without focal abnormality. Adrenals/Urinary Tract: Adrenal glands are within normal limits. Kidneys demonstrate mild hydronephrosis on the left. This is secondary to a 5 mm stone which now lies just above the ureterovesical junction somewhat further migrated when compared with the prior exam. No obstructive changes are noted on the right. Bilateral renal calculi are seen right greater than left. The largest of these is noted in the right renal pelvis again measuring up to 12 mm. The bladder is decompressed. Stomach/Bowel: Scattered diverticular change of the colon is noted. No  evidence of diverticulitis is seen. The appendix is within normal limits. Small bowel and stomach are unremarkable. Vascular/Lymphatic: Aortic atherosclerosis. No enlarged abdominal or pelvic lymph nodes. Reproductive: Prostate is unremarkable.  Other: No abdominal wall hernia or abnormality. No abdominopelvic ascites. Musculoskeletal: No acute or significant osseous findings. IMPRESSION: Distal left ureteral stone measuring 5 mm causing hydronephrosis. There has been some distal migration when compared with the prior exam. Bilateral nonobstructing renal calculi stable from the prior study. Changes consistent with prior cholecystostomy tube placement. Electronically Signed   By: Alcide Clever M.D.   On: 09/07/2022 11:54   IR Perc Cholecystostomy  Result Date: 09/06/2022 INDICATION: 72 year old gentleman with severe calculus cholecystitis including significant pericholecystic inflammation. He is a poor candidate for surgery at this time. Therefore, he presents for placement of a percutaneous cholecystostomy tube. EXAM: CHOLECYSTOSTOMY MEDICATIONS: In patient currently receiving intravenous antibiotics. No additional antibiotic prophylaxis was administered. ANESTHESIA/SEDATION: Moderate (conscious) sedation was employed during this procedure. A total of Versed 1 mg and Dilaudid 1.5 mg was administered intravenously. Moderate Sedation Time: 10 minutes. The patient's level of consciousness and vital signs were monitored continuously by radiology nursing throughout the procedure under my direct supervision. FLUOROSCOPY TIME:  Radiation exposure index: 9 mGy reference air kerma COMPLICATIONS: None immediate. PROCEDURE: Informed written consent was obtained from the patient after a thorough discussion of the procedural risks, benefits and alternatives. All questions were addressed. Maximal Sterile Barrier Technique was utilized including caps, mask, sterile gowns, sterile gloves, sterile drape, hand hygiene and skin antiseptic. A timeout was performed prior to the initiation of the procedure. The right upper quadrant was interrogated with ultrasound. The distended gallbladder was successfully identified. Local anesthesia was attained by infiltration with 1%  lidocaine. A small dermatotomy was made. Under real-time ultrasound guidance, a 21 gauge Accustick needle was advanced along a trans peritoneal course and into the fundus of the gallbladder. Needle was then directed toward the gallbladder neck. A 0.018 wire was coiled in the gallbladder lumen. The needle was exchanged for the Accustick transitional dilator which was advanced over the wire and into the gallbladder lumen. The wire was then exchanged for a 0.035 Amplatz wire. The trans cutaneous tract was dilated to 10 [place]. A Cook 10 [place] all-purpose drainage catheter was then advanced over the wire and formed in the gallbladder lumen. Aspiration yields 20 mL of thick black bile and debris. This was sent for Gram stain and culture. A gentle hand injection of contrast material was then performed confirming tube location within the gallbladder lumen. Images were obtained and stored for the medical record. The drain was connected to gravity bag drainage and secured to the skin with 0 Prolene suture. Sterile bandages were placed. IMPRESSION: Successful placement of trans peritoneal 10 French percutaneous cholecystostomy tube for the treatment of severe acute calculus cholecystitis. Electronically Signed   By: Malachy Moan M.D.   On: 09/06/2022 11:31   MR ABDOMEN WITH MRCP W CONTRAST  Result Date: 09/06/2022 CLINICAL DATA:  Cholecystitis. EXAM: MRI ABDOMEN WITH CONTRAST (WITH MRCP) TECHNIQUE: Multiplanar multisequence MR imaging of the abdomen was performed following the administration of intravenous contrast. Heavily T2-weighted images of the biliary and pancreatic ducts were obtained, and three-dimensional MRCP images were rendered by post processing. CONTRAST:  10mL GADAVIST GADOBUTROL 1 MMOL/ML IV SOLN COMPARISON:  Ultrasound and CT exams from earlier the same day FINDINGS: Lower chest: Dependent atelectasis. Hepatobiliary: No suspicious focal abnormality within the liver parenchyma. Gallbladder is  distended with gallbladder wall thickening/edema. Multiple  very tiny 1-2 mm stones are seen dependently in the lumen of gallbladder. There is extensive edema/inflammation in the gallbladder fossa extending down under the liver to involve the hepatic flexure of the colon. Edema/inflammation tracks in the hepatoduodenal ligament and retroperitoneal spaces to the descending duodenum. Trace fluid is seen at the inferior tip of the right liver. No intra or extrahepatic biliary duct dilatation. Common bile duct diameter is upper normal at 6 mm in the head of the pancreas. MRCP imaging shows no choledocholithiasis. Pancreas: No focal mass lesion. No dilatation of the main duct. No intraparenchymal cyst. No peripancreatic edema. Spleen:  No splenomegaly. No suspicious focal mass lesion. Adrenals/Urinary Tract: No adrenal nodule or mass. Tiny well-defined homogeneous T2 hyperintensities are seen in both kidneys, incompletely characterized on postcontrast imaging most compatible with simple cyst. No followup imaging is recommended. Stomach/Bowel: Stomach is unremarkable. No gastric wall thickening. No evidence of outlet obstruction. Duodenum is normally positioned as is the ligament of Treitz. No small bowel or colonic dilatation within the visualized abdomen. Vascular/Lymphatic: No abdominal aortic aneurysm. No abdominal aortic atherosclerotic calcification. There is no gastrohepatic or hepatoduodenal ligament lymphadenopathy. No retroperitoneal or mesenteric lymphadenopathy. Other: Trace free fluid in the right upper quadrant as described above. Musculoskeletal: No focal suspicious marrow enhancement within the visualized bony anatomy. IMPRESSION: 1. Cholelithiasis with gallbladder wall thickening/edema and extensive edema/inflammation in the gallbladder fossa extending down under the liver to involve the hepatic flexure of the colon. Edema/inflammation tracks in the hepatoduodenal ligament and retroperitoneal space to the  descending duodenum. Imaging features are compatible with acute cholecystitis. 2. No intra or extrahepatic biliary duct dilatation. Common bile duct diameter is upper normal at 6 mm in the head of the pancreas. MRCP imaging shows no choledocholithiasis. Electronically Signed   By: Kennith Center M.D.   On: 09/06/2022 07:00   MR 3D Recon At Scanner  Result Date: 09/06/2022 CLINICAL DATA:  Cholecystitis. EXAM: MRI ABDOMEN WITH CONTRAST (WITH MRCP) TECHNIQUE: Multiplanar multisequence MR imaging of the abdomen was performed following the administration of intravenous contrast. Heavily T2-weighted images of the biliary and pancreatic ducts were obtained, and three-dimensional MRCP images were rendered by post processing. CONTRAST:  10mL GADAVIST GADOBUTROL 1 MMOL/ML IV SOLN COMPARISON:  Ultrasound and CT exams from earlier the same day FINDINGS: Lower chest: Dependent atelectasis. Hepatobiliary: No suspicious focal abnormality within the liver parenchyma. Gallbladder is distended with gallbladder wall thickening/edema. Multiple very tiny 1-2 mm stones are seen dependently in the lumen of gallbladder. There is extensive edema/inflammation in the gallbladder fossa extending down under the liver to involve the hepatic flexure of the colon. Edema/inflammation tracks in the hepatoduodenal ligament and retroperitoneal spaces to the descending duodenum. Trace fluid is seen at the inferior tip of the right liver. No intra or extrahepatic biliary duct dilatation. Common bile duct diameter is upper normal at 6 mm in the head of the pancreas. MRCP imaging shows no choledocholithiasis. Pancreas: No focal mass lesion. No dilatation of the main duct. No intraparenchymal cyst. No peripancreatic edema. Spleen:  No splenomegaly. No suspicious focal mass lesion. Adrenals/Urinary Tract: No adrenal nodule or mass. Tiny well-defined homogeneous T2 hyperintensities are seen in both kidneys, incompletely characterized on postcontrast  imaging most compatible with simple cyst. No followup imaging is recommended. Stomach/Bowel: Stomach is unremarkable. No gastric wall thickening. No evidence of outlet obstruction. Duodenum is normally positioned as is the ligament of Treitz. No small bowel or colonic dilatation within the visualized abdomen. Vascular/Lymphatic: No abdominal aortic aneurysm. No  abdominal aortic atherosclerotic calcification. There is no gastrohepatic or hepatoduodenal ligament lymphadenopathy. No retroperitoneal or mesenteric lymphadenopathy. Other: Trace free fluid in the right upper quadrant as described above. Musculoskeletal: No focal suspicious marrow enhancement within the visualized bony anatomy. IMPRESSION: 1. Cholelithiasis with gallbladder wall thickening/edema and extensive edema/inflammation in the gallbladder fossa extending down under the liver to involve the hepatic flexure of the colon. Edema/inflammation tracks in the hepatoduodenal ligament and retroperitoneal space to the descending duodenum. Imaging features are compatible with acute cholecystitis. 2. No intra or extrahepatic biliary duct dilatation. Common bile duct diameter is upper normal at 6 mm in the head of the pancreas. MRCP imaging shows no choledocholithiasis. Electronically Signed   By: Kennith Center M.D.   On: 09/06/2022 07:00   US Abdomen Limited RUQ (LIVER/GB)  Result Date: 09/05/2022 CLINICAL DATA:  Abdominal pain for 3 days. EXAM: ULTRASOUND ABDOMEN LIMITED RIGHT UPPER QUADRANT COMPARISON:  CT AP from earlier today FINDINGS: Gallbladder: Gallbladder appears distended and is filled with sludge. Gallbladder wall thickness measures up to 6 mm. No stones identified. A negative sonographic Murphy's sign was reported by the sonographer. Common bile duct: Diameter: 5.0 mm Liver: Increased parenchymal echogenicity. No focal liver abnormality. Portal vein is patent on color Doppler imaging with normal direction of blood flow towards the liver. Other:  Exam detail is diminished secondary to overlying bowel gas and patient's body habitus. IMPRESSION: 1. Diminished exam detail due to body habitus and overlying bowel gas. 2. The gallbladder is distended and filled with sludge. There is gallbladder wall thickening measuring up to 6 mm. No stones identified. Negative sonographic Murphy's sign. 3. Despite absence of gallstones and positive sonographic Murphy's sign there is considerable inflammatory change noted within the right upper quadrant surrounding hepatic flexure and gallbladder on the CT from earlier today. Those findings remain suspicious for either acute cholecystitis or segmental colitis with secondary inflammation of the gallbladder. 4. Increased echogenicity of the liver parenchyma compatible with hepatic steatosis. Electronically Signed   By: Signa Kell M.D.   On: 09/05/2022 11:16   CT Renal Stone Study  Result Date: 09/05/2022 CLINICAL DATA:  Abdominal/flank pain.  Evaluate for kidney stone. EXAM: CT ABDOMEN AND PELVIS WITHOUT CONTRAST TECHNIQUE: Multidetector CT imaging of the abdomen and pelvis was performed following the standard protocol without IV contrast. RADIATION DOSE REDUCTION: This exam was performed according to the departmental dose-optimization program which includes automated exposure control, adjustment of the mA and/or kV according to patient size and/or use of iterative reconstruction technique. COMPARISON:  08/21/2018 FINDINGS: Lower chest: Bilateral pleural thickening and lower lobe subsegmental atelectasis is identified. No pleural fluid or airspace consolidation. Hepatobiliary: No suspicious liver lesion identified. There are inflammatory changes surrounding the gallbladder with possible gallbladder wall thickening (difficult to assess reflecting lack of IV contrast). Focal area of low density adjacent to the gallbladder fossa within segment 4 B is identified and is favored to represent focal fatty deposition, image 23/2.  Fat stranding in is also noted extending around the hepatic flexure which exhibits bowel wall edema. No calcified gallstones identified. No signs of bile duct dilatation. Pancreas: Unremarkable. No pancreatic ductal dilatation or surrounding inflammatory changes. Spleen: Normal in size without focal abnormality. Adrenals/Urinary Tract: Normal adrenal glands. Bilateral nephrolithiasis. The largest stone is identified within the right renal pelvis measuring 1.3 by 0.7 cm, image 48/2. There is left hydronephrosis and hydroureter. Stone within the distal ureter measures 5 mm, image 83/2 no right-sided hydronephrosis or hydroureter. Bladder is normal. Stomach/Bowel: Stomach  is within normal limits. The appendix is visualized and is normal. Short segment of wall thickening with surrounding inflammation involves the hepatic flexure adjacent to the gallbladder. Vascular/Lymphatic: Aortic atherosclerosis. No abdominopelvic adenopathy. Reproductive: Prostate is unremarkable. Other: There is a small amount of fluid along the peritoneal reflection at the level of the left iliac fossa, image 78/2. No focal fluid collections identified to suggest an abscess. Musculoskeletal: No acute or significant osseous findings. No acute or suspicious osseous findings. IMPRESSION: 1. Bilateral nephrolithiasis. Left-sided hydronephrosis and hydroureter secondary to 5 mm distal left ureteral calculus. 2. There are inflammatory changes surrounding the gallbladder. No calcified gallstones identified. If there is a clinical concern for acute cholecystitis consider further evaluation with right upper quadrant ultrasound. 3. Short segment of wall thickening with surrounding inflammation involves the hepatic flexure adjacent to the gallbladder. This is favored to represent secondary inflammation of the colon. Less favored would be acute diverticulitis or short segment colitis with secondary inflammation of the gallbladder. 4.  Aortic Atherosclerosis  (ICD10-I70.0). Electronically Signed   By: Signa Kell M.D.   On: 09/05/2022 09:33    Labs:  CBC: Recent Labs    09/05/22 0601 09/06/22 0338 09/07/22 0350  WBC 15.3* 22.6* 16.4*  HGB 13.3 14.4 12.2*  HCT 40.1 44.3 38.8*  PLT 160 183 169    COAGS: Recent Labs    09/06/22 1208  INR 1.4*    BMP: Recent Labs    09/05/22 0601 09/06/22 0338 09/07/22 0350  NA 137 137 138  K 4.0 4.5 4.2  CL 105 104 106  CO2 22 23 22   GLUCOSE 135* 130* 117*  BUN 27* 27* 40*  CALCIUM 9.0 8.5* 8.1*  CREATININE 1.87* 1.99* 2.01*  GFRNONAA 38* 35* 35*    LIVER FUNCTION TESTS: Recent Labs    09/05/22 0601 09/06/22 0338 09/07/22 0350  BILITOT 1.0 1.1 1.2  AST 28 33 24  ALT 27 28 22   ALKPHOS 54 56 48  PROT 7.3 7.0 6.2*  ALBUMIN 3.8 3.3* 2.9*    Assessment and Plan:  Acute calculus cholecystitis s/p percutaneous cholecystostomy drain placement -Patient is tolerating drain well, no signs of infection at drain insertion site -WBC 15.3 today, was 22.6 on 6/23 -Hgb 13.3 today, 14.4 on 6/23 -Further management per surgical team  Plan: Record output Q shift. Dressing changes QD or PRN if soiled.  Call IR APP or on call IR MD if difficulty flushing or sudden change in drain output.   Discharge planning: Please contact IR APP or on call IR MD prior to patient d/c to ensure appropriate follow up plans are in place. Typically patient will follow up with IR in 6-8 weeks post d/c for cholangiogram with possible drain exchange. IR scheduler will contact patient with date/time of appointment. Patient will need to flush drain QD with 5 cc NS, record output QD, dressing changes every 2-3 days or earlier if soiled.   IR will continue to follow - please call with questions or concerns.    Electronically Signed: Kennieth Francois, PA-C 09/07/2022, 2:26 PM   I spent a total of 15 Minutes at the the patient's bedside AND on the patient's hospital floor or unit, greater than 50% of which was  counseling/coordinating care for acute cholecystitis.

## 2022-09-07 NOTE — Progress Notes (Signed)
Transition of Care [facility]) - Inpatient Brief Assessment   Patient Details  Name: Frank Gilmore MRN: [id number] Date of Birth: [birth year]  [facility] CM/SW Contact:    Larrie Kass, LCSW Phone Number: 09/07/2022, 3:03 PM   Clinical Narrative:  Transition of Care Department [facility]) has reviewed patient and no TOC needs have been identified at this time. We will continue to monitor patient advancement through interdisciplinary progression rounds. If new patient transition needs arise, please place a TOC consult.    Transition of Care Asessment: Insurance and Status: Insurance coverage has been reviewed Patient has primary care physician: Yes Home environment has been reviewed: yes   Prior/Current Home Services: No current home services Social Determinants of Health Reivew: SDOH reviewed no interventions necessary Readmission risk has been reviewed: Yes Transition of care needs: no transition of care needs at this time

## 2022-09-08 ENCOUNTER — Other Ambulatory Visit (HOSPITAL_COMMUNITY): Payer: Self-pay

## 2022-09-08 DIAGNOSIS — R109 Unspecified abdominal pain: Secondary | ICD-10-CM | POA: Diagnosis not present

## 2022-09-08 DIAGNOSIS — K810 Acute cholecystitis: Secondary | ICD-10-CM | POA: Diagnosis not present

## 2022-09-08 DIAGNOSIS — K819 Cholecystitis, unspecified: Secondary | ICD-10-CM | POA: Diagnosis not present

## 2022-09-08 LAB — AEROBIC/ANAEROBIC CULTURE W GRAM STAIN (SURGICAL/DEEP WOUND)

## 2022-09-08 LAB — CBC
HCT: 33.4 % — ABNORMAL LOW (ref 39.0–52.0)
Hemoglobin: 11 g/dL — ABNORMAL LOW (ref 13.0–17.0)
MCH: 31.4 pg (ref 26.0–34.0)
MCHC: 32.9 g/dL (ref 30.0–36.0)
MCV: 95.4 fL (ref 80.0–100.0)
Platelets: 173 10*3/uL (ref 150–400)
RBC: 3.5 MIL/uL — ABNORMAL LOW (ref 4.22–5.81)
RDW: 13.2 % (ref 11.5–15.5)
WBC: 12.2 10*3/uL — ABNORMAL HIGH (ref 4.0–10.5)
nRBC: 0 % (ref 0.0–0.2)

## 2022-09-08 LAB — COMPREHENSIVE METABOLIC PANEL
ALT: 25 U/L (ref 0–44)
AST: 27 U/L (ref 15–41)
Albumin: 2.7 g/dL — ABNORMAL LOW (ref 3.5–5.0)
Alkaline Phosphatase: 54 U/L (ref 38–126)
Anion gap: 10 (ref 5–15)
BUN: 40 mg/dL — ABNORMAL HIGH (ref 8–23)
CO2: 20 mmol/L — ABNORMAL LOW (ref 22–32)
Calcium: 8.1 mg/dL — ABNORMAL LOW (ref 8.9–10.3)
Chloride: 111 mmol/L (ref 98–111)
Creatinine, Ser: 1.74 mg/dL — ABNORMAL HIGH (ref 0.61–1.24)
GFR, Estimated: 41 mL/min — ABNORMAL LOW (ref >60)
Glucose, Bld: 109 mg/dL — ABNORMAL HIGH (ref 70–99)
Potassium: 3.9 mmol/L (ref 3.5–5.1)
Sodium: 141 mmol/L (ref 135–145)
Total Bilirubin: 0.6 mg/dL (ref 0.3–1.2)
Total Protein: 6 g/dL — ABNORMAL LOW (ref 6.5–8.1)

## 2022-09-08 MED ORDER — AMOXICILLIN-POT CLAVULANATE 875-125 MG PO TABS: 1.0000 | ORAL_TABLET | Freq: Two times a day (BID) | ORAL | 0 refills | Status: AC

## 2022-09-08 MED ORDER — NORMAL SALINE FLUSH 0.9 % IV SOLN
10.0000 mL | INTRAVENOUS | 2 refills | Status: AC | PRN
  Filled 2022-09-08: qty 300, 30d supply, fill #0

## 2022-09-08 MED ORDER — TAMSULOSIN HCL 0.4 MG PO CAPS: 0.4000 mg | ORAL_CAPSULE | Freq: Every day | ORAL | 0 refills | Status: AC

## 2022-09-08 NOTE — Progress Notes (Signed)
Subjective: CC: Mild RUQ abdominal pain. Tolerating fld without n/v.   Objective: Vital signs in last 24 hours: Temp:  [98 F (36.7 C)-98.8 F (37.1 C)] 98 F (36.7 C) (06/25 0433) Pulse Rate:  [67-85] 67 (06/25 0433) Resp:  [18-24] 18 (06/25 0433) BP: (120-133)/(79-85) 128/79 (06/25 0433) SpO2:  [94 %-96 %] 94 % (06/25 0433) Last BM Date : 09/08/22  Intake/Output from previous day: 06/24 0701 - 06/25 0700 In: 3278.5 [P.O.:600; I.V.:2432.8; IV Piggyback:245.7] Out: 515 [Urine:400; Drains:115] Intake/Output this shift: Total I/O In: 360 [P.O.:360] Out: -   PE: Gen:  Alert, NAD, pleasant Abd: Soft, ND, very mild ruq ttp. +BS. IR perc chole drain with bilious output in gravity bag.   Lab Results:  Recent Labs    09/07/22 0350 09/08/22 0402  WBC 16.4* 12.2*  HGB 12.2* 11.0*  HCT 38.8* 33.4*  PLT 169 173   BMET Recent Labs    09/07/22 0350 09/08/22 0402  NA 138 141  K 4.2 3.9  CL 106 111  CO2 22 20*  GLUCOSE 117* 109*  BUN 40* 40*  CREATININE 2.01* 1.74*  CALCIUM 8.1* 8.1*   PT/INR Recent Labs    09/06/22 1208  LABPROT 17.2*  INR 1.4*   CMP     Component Value Date/Time   NA 141 09/08/2022 0402   K 3.9 09/08/2022 0402   CL 111 09/08/2022 0402   CO2 20 (L) 09/08/2022 0402   GLUCOSE 109 (H) 09/08/2022 0402   BUN 40 (H) 09/08/2022 0402   CREATININE 1.74 (H) 09/08/2022 0402   CALCIUM 8.1 (L) 09/08/2022 0402   PROT 6.0 (L) 09/08/2022 0402   ALBUMIN 2.7 (L) 09/08/2022 0402   AST 27 09/08/2022 0402   ALT 25 09/08/2022 0402   ALKPHOS 54 09/08/2022 0402   BILITOT 0.6 09/08/2022 0402   GFRNONAA 41 (L) 09/08/2022 0402   GFRAA >60 09/10/2018 1514   Lipase     Component Value Date/Time   LIPASE 31 09/05/2022 0601    Studies/Results: CT RENAL STONE STUDY  Result Date: 09/07/2022 CLINICAL DATA:  Flank pain, evaluate for renal calculi EXAM: CT ABDOMEN AND PELVIS WITHOUT CONTRAST TECHNIQUE: Multidetector CT imaging of the abdomen and pelvis  was performed following the standard protocol without IV contrast. RADIATION DOSE REDUCTION: This exam was performed according to the departmental dose-optimization program which includes automated exposure control, adjustment of the mA and/or kV according to patient size and/or use of iterative reconstruction technique. COMPARISON:  09/05/2022 FINDINGS: Lower chest: Right basilar atelectasis is noted with small effusion. Mild left basilar atelectasis is seen as well. Hepatobiliary: Cholecystostomy tube is noted in place. Liver appears within normal limits. Mild inflammatory changes are noted surrounding the gallbladder. Pancreas: Unremarkable. No pancreatic ductal dilatation or surrounding inflammatory changes. Spleen: Normal in size without focal abnormality. Adrenals/Urinary Tract: Adrenal glands are within normal limits. Kidneys demonstrate mild hydronephrosis on the left. This is secondary to a 5 mm stone which now lies just above the ureterovesical junction somewhat further migrated when compared with the prior exam. No obstructive changes are noted on the right. Bilateral renal calculi are seen right greater than left. The largest of these is noted in the right renal pelvis again measuring up to 12 mm. The bladder is decompressed. Stomach/Bowel: Scattered diverticular change of the colon is noted. No evidence of diverticulitis is seen. The appendix is within normal limits. Small bowel and stomach are unremarkable. Vascular/Lymphatic: Aortic atherosclerosis. No enlarged abdominal or pelvic  lymph nodes. Reproductive: Prostate is unremarkable. Other: No abdominal wall hernia or abnormality. No abdominopelvic ascites. Musculoskeletal: No acute or significant osseous findings. IMPRESSION: Distal left ureteral stone measuring 5 mm causing hydronephrosis. There has been some distal migration when compared with the prior exam. Bilateral nonobstructing renal calculi stable from the prior study. Changes consistent with  prior cholecystostomy tube placement. Electronically Signed   By: Alcide Clever M.D.   On: 09/07/2022 11:54   IR Perc Cholecystostomy  Result Date: 09/06/2022 INDICATION: 72 year old gentleman with severe calculus cholecystitis including significant pericholecystic inflammation. He is a poor candidate for surgery at this time. Therefore, he presents for placement of a percutaneous cholecystostomy tube. EXAM: CHOLECYSTOSTOMY MEDICATIONS: In patient currently receiving intravenous antibiotics. No additional antibiotic prophylaxis was administered. ANESTHESIA/SEDATION: Moderate (conscious) sedation was employed during this procedure. A total of Versed 1 mg and Dilaudid 1.5 mg was administered intravenously. Moderate Sedation Time: 10 minutes. The patient's level of consciousness and vital signs were monitored continuously by radiology nursing throughout the procedure under my direct supervision. FLUOROSCOPY TIME:  Radiation exposure index: 9 mGy reference air kerma COMPLICATIONS: None immediate. PROCEDURE: Informed written consent was obtained from the patient after a thorough discussion of the procedural risks, benefits and alternatives. All questions were addressed. Maximal Sterile Barrier Technique was utilized including caps, mask, sterile gowns, sterile gloves, sterile drape, hand hygiene and skin antiseptic. A timeout was performed prior to the initiation of the procedure. The right upper quadrant was interrogated with ultrasound. The distended gallbladder was successfully identified. Local anesthesia was attained by infiltration with 1% lidocaine. A small dermatotomy was made. Under real-time ultrasound guidance, a 21 gauge Accustick needle was advanced along a trans peritoneal course and into the fundus of the gallbladder. Needle was then directed toward the gallbladder neck. A 0.018 wire was coiled in the gallbladder lumen. The needle was exchanged for the Accustick transitional dilator which was advanced  over the wire and into the gallbladder lumen. The wire was then exchanged for a 0.035 Amplatz wire. The trans cutaneous tract was dilated to 10 [place]. A Cook 10 [place] all-purpose drainage catheter was then advanced over the wire and formed in the gallbladder lumen. Aspiration yields 20 mL of thick black bile and debris. This was sent for Gram stain and culture. A gentle hand injection of contrast material was then performed confirming tube location within the gallbladder lumen. Images were obtained and stored for the medical record. The drain was connected to gravity bag drainage and secured to the skin with 0 Prolene suture. Sterile bandages were placed. IMPRESSION: Successful placement of trans peritoneal 10 French percutaneous cholecystostomy tube for the treatment of severe acute calculus cholecystitis. Electronically Signed   By: Malachy Moan M.D.   On: 09/06/2022 11:31    Anti-infectives: Anti-infectives (From admission, onward)    Start     Dose/Rate Route Frequency Ordered Stop   09/06/22 1515  piperacillin-tazobactam (ZOSYN) IVPB 3.375 g        3.375 g 12.5 mL/hr over 240 Minutes Intravenous Every 8 hours 09/06/22 1417     09/06/22 1000  cefOXitin (MEFOXIN) 2 g in sodium chloride 0.9 % 100 mL IVPB       Note to Pharmacy: To Interventional Radiology.   2 g 200 mL/hr over 30 Minutes Intravenous To Radiology 09/06/22 0911 09/07/22 1000   09/05/22 1200  piperacillin-tazobactam (ZOSYN) IVPB 3.375 g        3.375 g 100 mL/hr over 30 Minutes Intravenous  Once 09/05/22 1147  09/05/22 1230        Assessment/Plan Severe acute cholecystitis with cholelithiasis - s/p perc chole 6/23, micro pending, NGTD. Cont abx x 5d after drain placement. Recommend Augmentin as PO option while cx's pending. Defer drain management to IR (flushes, restrictions etc). Will need f/u with IR for repeat cholangiogram before f/u as o/p with Dr. Magnus Ivan in ~6 weeks. Discussed plan with patient and his wife who was  at bedside.  Left ureteral stone - Defer to Urology FEN - Adv to reg diet.  DVT - SCDs, LMWH Dispo - Okay for d/c from our standpoint. Please call back with any questions or concerns.    I reviewed nursing notes, last 24 h vitals and pain scores, last 48 h intake and output, last 24 h labs and trends, and last 24 h imaging results.   LOS: 3 days    Jacinto Halim , [facility] 09/08/2022, 9:49 AM Please see Amion for pager number during day hours 7:00am-4:30pm

## 2022-09-08 NOTE — Progress Notes (Signed)
AVS given to patient and explained at the bedside. Medications and follow up appointments have been explained with pt verbalizing understanding.  

## 2022-09-08 NOTE — Progress Notes (Addendum)
Subjective: Patient with no flank pain.  Continue to have some abdominal pain.  MRI suggested severe cholecystitis and is undergoing percutaneous cholecystostomy drainage today.  Serum creatinine 1.99 slightly worse but may be related to ongoing gallbladder issues. Vital signs in last 24 hours: Temp:  [98 F (36.7 C)-98.8 F (37.1 C)] 98 F (36.7 C) (06/25 0433) Pulse Rate:  [67-85] 67 (06/25 0433) Resp:  [18-24] 18 (06/25 0433) BP: (120-133)/(79-85) 128/79 (06/25 0433) SpO2:  [94 %-96 %] 94 % (06/25 0433)  Intake/Output from previous day: 06/24 0701 - 06/25 0700 In: 3278.5 [P.O.:600; I.V.:2432.8; IV Piggyback:245.7] Out: 515 [Urine:400; Drains:115] Intake/Output this shift: Total I/O In: 360 [P.O.:360] Out: -   Physical Exam:  General: Alert and oriented C Lab Results: Recent Labs    09/06/22 0338 09/07/22 0350 09/08/22 0402  HGB 14.4 12.2* 11.0*  HCT 44.3 38.8* 33.4*   BMET Recent Labs    09/07/22 0350 09/08/22 0402  NA 138 141  K 4.2 3.9  CL 106 111  CO2 22 20*  GLUCOSE 117* 109*  BUN 40* 40*  CREATININE 2.01* 1.74*  CALCIUM 8.1* 8.1*     Studies/Results: CT RENAL STONE STUDY  Result Date: 09/07/2022 CLINICAL DATA:  Flank pain, evaluate for renal calculi EXAM: CT ABDOMEN AND PELVIS WITHOUT CONTRAST TECHNIQUE: Multidetector CT imaging of the abdomen and pelvis was performed following the standard protocol without IV contrast. RADIATION DOSE REDUCTION: This exam was performed according to the departmental dose-optimization program which includes automated exposure control, adjustment of the mA and/or kV according to patient size and/or use of iterative reconstruction technique. COMPARISON:  09/05/2022 FINDINGS: Lower chest: Right basilar atelectasis is noted with small effusion. Mild left basilar atelectasis is seen as well. Hepatobiliary: Cholecystostomy tube is noted in place. Liver appears within normal limits. Mild inflammatory changes are noted surrounding  the gallbladder. Pancreas: Unremarkable. No pancreatic ductal dilatation or surrounding inflammatory changes. Spleen: Normal in size without focal abnormality. Adrenals/Urinary Tract: Adrenal glands are within normal limits. Kidneys demonstrate mild hydronephrosis on the left. This is secondary to a 5 mm stone which now lies just above the ureterovesical junction somewhat further migrated when compared with the prior exam. No obstructive changes are noted on the right. Bilateral renal calculi are seen right greater than left. The largest of these is noted in the right renal pelvis again measuring up to 12 mm. The bladder is decompressed. Stomach/Bowel: Scattered diverticular change of the colon is noted. No evidence of diverticulitis is seen. The appendix is within normal limits. Small bowel and stomach are unremarkable. Vascular/Lymphatic: Aortic atherosclerosis. No enlarged abdominal or pelvic lymph nodes. Reproductive: Prostate is unremarkable. Other: No abdominal wall hernia or abnormality. No abdominopelvic ascites. Musculoskeletal: No acute or significant osseous findings. IMPRESSION: Distal left ureteral stone measuring 5 mm causing hydronephrosis. There has been some distal migration when compared with the prior exam. Bilateral nonobstructing renal calculi stable from the prior study. Changes consistent with prior cholecystostomy tube placement. Electronically Signed   By: Alcide Clever M.D.   On: 09/07/2022 11:54   IR Perc Cholecystostomy  Result Date: 09/06/2022 INDICATION: 72 year old gentleman with severe calculus cholecystitis including significant pericholecystic inflammation. He is a poor candidate for surgery at this time. Therefore, he presents for placement of a percutaneous cholecystostomy tube. EXAM: CHOLECYSTOSTOMY MEDICATIONS: In patient currently receiving intravenous antibiotics. No additional antibiotic prophylaxis was administered. ANESTHESIA/SEDATION: Moderate (conscious) sedation was  employed during this procedure. A total of Versed 1 mg and Dilaudid 1.5 mg was administered  intravenously. Moderate Sedation Time: 10 minutes. The patient's level of consciousness and vital signs were monitored continuously by radiology nursing throughout the procedure under my direct supervision. FLUOROSCOPY TIME:  Radiation exposure index: 9 mGy reference air kerma COMPLICATIONS: None immediate. PROCEDURE: Informed written consent was obtained from the patient after a thorough discussion of the procedural risks, benefits and alternatives. All questions were addressed. Maximal Sterile Barrier Technique was utilized including caps, mask, sterile gowns, sterile gloves, sterile drape, hand hygiene and skin antiseptic. A timeout was performed prior to the initiation of the procedure. The right upper quadrant was interrogated with ultrasound. The distended gallbladder was successfully identified. Local anesthesia was attained by infiltration with 1% lidocaine. A small dermatotomy was made. Under real-time ultrasound guidance, a 21 gauge Accustick needle was advanced along a trans peritoneal course and into the fundus of the gallbladder. Needle was then directed toward the gallbladder neck. A 0.018 wire was coiled in the gallbladder lumen. The needle was exchanged for the Accustick transitional dilator which was advanced over the wire and into the gallbladder lumen. The wire was then exchanged for a 0.035 Amplatz wire. The trans cutaneous tract was dilated to 10 [place]. A Cook 10 [place] all-purpose drainage catheter was then advanced over the wire and formed in the gallbladder lumen. Aspiration yields 20 mL of thick black bile and debris. This was sent for Gram stain and culture. A gentle hand injection of contrast material was then performed confirming tube location within the gallbladder lumen. Images were obtained and stored for the medical record. The drain was connected to gravity bag drainage and secured to the  skin with 0 Prolene suture. Sterile bandages were placed. IMPRESSION: Successful placement of trans peritoneal 10 French percutaneous cholecystostomy tube for the treatment of severe acute calculus cholecystitis. Electronically Signed   By: Malachy Moan M.D.   On: 09/06/2022 11:31    Assessment/Plan:  #Urolithiosis 5mm distal left stone. Medical expulsive therapy.  CT stone collected 6/24 shows progression to just above the UVJ. Interval improvement in serum creatinine now that patient is getting adequate hydration.  Would continue fluids unless contraindicated. 2.01-->1.74. Trend daily labs Patient has past some broken up stone material overnight.  He reports the pain has resolved.  He may have passed the stone into the bladder or stone is starting to break up.  I have advised him to continue straining his urine.  He may discharge home from urologic perspective and continue this process on an outpatient basis.  He has been advised that he will need to follow-up closely with PCP or my office within 72 hours for labs if he leaves before resolution of his kidney injury.  #Acute cholecystitis  now requiring percutaneous cholecystostomy drain Gen Surg following     LOS: 3 days   CAMERON W [place] 09/08/2022, 9:27 AM Addendum: Patient seen this morning.  May have passed stone overnight is currently asymptomatic on the left side.  Renal function improving.  Agree that could be discharged home from urologic standpoint.  Can follow-up either here in [place] or in the [place] office with Dr. Ronne Binning as he expressed desire to stay in the [place] area closer to home.  Would recommend continuing on tamsulosin 0.4 mg daily for at least a week after discharge.

## 2022-09-08 NOTE — Discharge Summary (Signed)
Physician Discharge Summary   Patient: Frank Gilmore MRN: [id number] DOB: [birth year]  Admit date:     09/05/2022  Discharge date: 09/08/22  Discharge Physician: Rickey Barbara   PCP: Benita Stabile, MD   Recommendations at discharge:    Follow up with PCP in 1-2 weeks Follow up with General Surgery as scheduled Follow up with Urology as scheduled  Discharge Diagnoses: Principal Problem:   Acute cholecystitis  Resolved Problems:   * No resolved hospital problems. *  Hospital Course: 72 year old male with medical history of prior kidney stones, hypertension, hyperlipidemia came to [facility] ER with right-sided flank pain right upper quadrant pain. Patient reports passing 4 kidney stone in the past 2 to 3 days then he began to feel better. However 2 days ago he started having right upper quadrant pain. In the ED CT scan showed bilateral nephrolithiasis, left-sided hydronephrosis and hydroureter secondary to 5 mm distal left ureteral calculus. There was also inflammatory changes at the gallbladder, right upper quadrant ultrasound showed significant inflammation of the gallbladder consistent with cholecystitis. Also bowel segment with wall thickening around the hepatic flexure suspected to be secondary to gallbladder inflammation. General surgery was consulted, recommended medical admission and consult with urology. MRI abdomen was ordered.   Assessment and Plan: Cholecystitis -Seen on CT abdomen/pelvis, -MRI abdomen showed extensive edema inflammation of the gallbladder with significant extension to surrounding bowel, no evidence of stone in the common bile duct -General surgery recommended cholecystostomy tube and delay cholecystectomy -IR was consulted for cholecystostomy tube placement; underwent tube placement 6/23 -Was continued with Zosyn per pharmacy -Will discharge on Augmentin on d/c     Nephrolithiasis with left hydronephrosis -5 mm distal left ureteral calculus -Urology was  consulted, recommended IV fluids and tamsulosin to help with passing stone   Hypertension -Blood pressure has remained stable -HCTZ and Cozaar on hold due to AKI   Acute kidney injury -Presented with creatinine of 1.87, increased to 2.01 -Continued normal saline at 100 mL/h -Avoid nephrotoxins -CT renal stone study obtained showed distal left ureteral stone measuring 5 mm causing hydronephrosis.  There has been some distal migration when compared to prior exam. -OK to d/c per Urology recs   Diarrhea -Likely antibiotic induced -Start Imodium as needed    Consultants: Urology, IR, general Surgery Procedures performed: cholecystostomy drain placement  Disposition: Home Diet recommendation:  Regular diet DISCHARGE MEDICATION: Allergies as of 09/08/2022   No Known Allergies      Medication List     STOP taking these medications    hydrochlorothiazide 12.5 MG tablet Commonly known as: HYDRODIURIL   losartan 100 MG tablet Commonly known as: COZAAR   naproxen sodium 220 MG tablet Commonly known as: ALEVE       TAKE these medications    amoxicillin-clavulanate 875-125 MG tablet Commonly known as: AUGMENTIN Take 1 tablet by mouth 2 (two) times daily for 14 days.   aspirin EC 81 MG tablet Take 81 mg by mouth daily.   multivitamins ther. w/minerals Tabs tablet Take 1 tablet by mouth daily.   pravastatin 40 MG tablet Commonly known as: PRAVACHOL Take 1 tablet by mouth daily.   sodium chloride flush 0.9 % Soln injection 10 mLs by Intracatheter route as needed. Flush drain once daily with 5 ml normal saline   tamsulosin 0.4 MG Caps capsule Commonly known as: FLOMAX Take 1 capsule (0.4 mg total) by mouth daily. Start taking on: September 09, 2022        Follow-up Information  Benita Stabile, MD Follow up.   Specialty: Internal Medicine Contact information: [address] [place] [address] [phone]         Abigail Miyamoto, MD Follow up.    Specialty: General Surgery Why: Call to confirm your appointment date and time, bring a copy of your photo ID and insurance card, arrive 30 minutes prior to your appointment Contact information: [address] [place] [address] [phone]         Sterling Big, MD Follow up.   Specialties: Interventional Radiology, Radiology Why: Please ensure you have follow up for a cholangiogram with interventional radiology before your appointment with Dr. Kendrick Ranch information: [address] [place] [address] [phone]         Malen Gauze, MD Follow up.   Specialty: Urology Why: Hospital follow up Contact information: [address]  [address] [place] [address] [phone]                Discharge Exam: Ceasar Mons Weights   09/05/22 0559  Weight: 99.8 kg   General exam: Awake, laying in bed, in nad Respiratory system: Normal respiratory effort, no wheezing Cardiovascular system: regular rate, s1, s2 Gastrointestinal system: Soft, nondistended, positive BS Central nervous system: CN2-12 grossly intact, strength intact Extremities: Perfused, no clubbing Skin: Normal skin turgor, no notable skin lesions seen Psychiatry: Mood normal // no visual hallucinations   Condition at discharge: fair  The results of significant diagnostics from this hospitalization (including imaging, microbiology, ancillary and laboratory) are listed below for reference.   Imaging Studies: CT RENAL STONE STUDY  Result Date: 09/07/2022 CLINICAL DATA:  Flank pain, evaluate for renal calculi EXAM: CT ABDOMEN AND PELVIS WITHOUT CONTRAST TECHNIQUE: Multidetector CT imaging of the abdomen and pelvis was performed following the standard protocol without IV contrast. RADIATION DOSE REDUCTION: This exam was performed according to the departmental dose-optimization program which includes automated exposure control, adjustment of the mA and/or kV  according to patient size and/or use of iterative reconstruction technique. COMPARISON:  09/05/2022 FINDINGS: Lower chest: Right basilar atelectasis is noted with small effusion. Mild left basilar atelectasis is seen as well. Hepatobiliary: Cholecystostomy tube is noted in place. Liver appears within normal limits. Mild inflammatory changes are noted surrounding the gallbladder. Pancreas: Unremarkable. No pancreatic ductal dilatation or surrounding inflammatory changes. Spleen: Normal in size without focal abnormality. Adrenals/Urinary Tract: Adrenal glands are within normal limits. Kidneys demonstrate mild hydronephrosis on the left. This is secondary to a 5 mm stone which now lies just above the ureterovesical junction somewhat further migrated when compared with the prior exam. No obstructive changes are noted on the right. Bilateral renal calculi are seen right greater than left. The largest of these is noted in the right renal pelvis again measuring up to 12 mm. The bladder is decompressed. Stomach/Bowel: Scattered diverticular change of the colon is noted. No evidence of diverticulitis is seen. The appendix is within normal limits. Small bowel and stomach are unremarkable. Vascular/Lymphatic: Aortic atherosclerosis. No enlarged abdominal or pelvic lymph nodes. Reproductive: Prostate is unremarkable. Other: No abdominal wall hernia or abnormality. No abdominopelvic ascites. Musculoskeletal: No acute or significant osseous findings. IMPRESSION: Distal left ureteral stone measuring 5 mm causing hydronephrosis. There has been some distal migration when compared with the prior exam. Bilateral nonobstructing renal calculi stable from the prior study. Changes consistent with prior cholecystostomy tube placement. Electronically Signed   By: Alcide Clever M.D.   On:  09/07/2022 11:54   IR Perc Cholecystostomy  Result Date: 09/06/2022 INDICATION: 72 year old gentleman with severe calculus cholecystitis including  significant pericholecystic inflammation. He is a poor candidate for surgery at this time. Therefore, he presents for placement of a percutaneous cholecystostomy tube. EXAM: CHOLECYSTOSTOMY MEDICATIONS: In patient currently receiving intravenous antibiotics. No additional antibiotic prophylaxis was administered. ANESTHESIA/SEDATION: Moderate (conscious) sedation was employed during this procedure. A total of Versed 1 mg and Dilaudid 1.5 mg was administered intravenously. Moderate Sedation Time: 10 minutes. The patient's level of consciousness and vital signs were monitored continuously by radiology nursing throughout the procedure under my direct supervision. FLUOROSCOPY TIME:  Radiation exposure index: 9 mGy reference air kerma COMPLICATIONS: None immediate. PROCEDURE: Informed written consent was obtained from the patient after a thorough discussion of the procedural risks, benefits and alternatives. All questions were addressed. Maximal Sterile Barrier Technique was utilized including caps, mask, sterile gowns, sterile gloves, sterile drape, hand hygiene and skin antiseptic. A timeout was performed prior to the initiation of the procedure. The right upper quadrant was interrogated with ultrasound. The distended gallbladder was successfully identified. Local anesthesia was attained by infiltration with 1% lidocaine. A small dermatotomy was made. Under real-time ultrasound guidance, a 21 gauge Accustick needle was advanced along a trans peritoneal course and into the fundus of the gallbladder. Needle was then directed toward the gallbladder neck. A 0.018 wire was coiled in the gallbladder lumen. The needle was exchanged for the Accustick transitional dilator which was advanced over the wire and into the gallbladder lumen. The wire was then exchanged for a 0.035 Amplatz wire. The trans cutaneous tract was dilated to 10 [place]. A Cook 10 [place] all-purpose drainage catheter was then advanced over the wire and formed  in the gallbladder lumen. Aspiration yields 20 mL of thick black bile and debris. This was sent for Gram stain and culture. A gentle hand injection of contrast material was then performed confirming tube location within the gallbladder lumen. Images were obtained and stored for the medical record. The drain was connected to gravity bag drainage and secured to the skin with 0 Prolene suture. Sterile bandages were placed. IMPRESSION: Successful placement of trans peritoneal 10 French percutaneous cholecystostomy tube for the treatment of severe acute calculus cholecystitis. Electronically Signed   By: Malachy Moan M.D.   On: 09/06/2022 11:31   MR ABDOMEN WITH MRCP W CONTRAST  Result Date: 09/06/2022 CLINICAL DATA:  Cholecystitis. EXAM: MRI ABDOMEN WITH CONTRAST (WITH MRCP) TECHNIQUE: Multiplanar multisequence MR imaging of the abdomen was performed following the administration of intravenous contrast. Heavily T2-weighted images of the biliary and pancreatic ducts were obtained, and three-dimensional MRCP images were rendered by post processing. CONTRAST:  10mL GADAVIST GADOBUTROL 1 MMOL/ML IV SOLN COMPARISON:  Ultrasound and CT exams from earlier the same day FINDINGS: Lower chest: Dependent atelectasis. Hepatobiliary: No suspicious focal abnormality within the liver parenchyma. Gallbladder is distended with gallbladder wall thickening/edema. Multiple very tiny 1-2 mm stones are seen dependently in the lumen of gallbladder. There is extensive edema/inflammation in the gallbladder fossa extending down under the liver to involve the hepatic flexure of the colon. Edema/inflammation tracks in the hepatoduodenal ligament and retroperitoneal spaces to the descending duodenum. Trace fluid is seen at the inferior tip of the right liver. No intra or extrahepatic biliary duct dilatation. Common bile duct diameter is upper normal at 6 mm in the head of the pancreas. MRCP imaging shows no choledocholithiasis. Pancreas:  No focal mass lesion. No dilatation of the main duct.  No intraparenchymal cyst. No peripancreatic edema. Spleen:  No splenomegaly. No suspicious focal mass lesion. Adrenals/Urinary Tract: No adrenal nodule or mass. Tiny well-defined homogeneous T2 hyperintensities are seen in both kidneys, incompletely characterized on postcontrast imaging most compatible with simple cyst. No followup imaging is recommended. Stomach/Bowel: Stomach is unremarkable. No gastric wall thickening. No evidence of outlet obstruction. Duodenum is normally positioned as is the ligament of Treitz. No small bowel or colonic dilatation within the visualized abdomen. Vascular/Lymphatic: No abdominal aortic aneurysm. No abdominal aortic atherosclerotic calcification. There is no gastrohepatic or hepatoduodenal ligament lymphadenopathy. No retroperitoneal or mesenteric lymphadenopathy. Other: Trace free fluid in the right upper quadrant as described above. Musculoskeletal: No focal suspicious marrow enhancement within the visualized bony anatomy. IMPRESSION: 1. Cholelithiasis with gallbladder wall thickening/edema and extensive edema/inflammation in the gallbladder fossa extending down under the liver to involve the hepatic flexure of the colon. Edema/inflammation tracks in the hepatoduodenal ligament and retroperitoneal space to the descending duodenum. Imaging features are compatible with acute cholecystitis. 2. No intra or extrahepatic biliary duct dilatation. Common bile duct diameter is upper normal at 6 mm in the head of the pancreas. MRCP imaging shows no choledocholithiasis. Electronically Signed   By: Kennith Center M.D.   On: 09/06/2022 07:00   MR 3D Recon At Scanner  Result Date: 09/06/2022 CLINICAL DATA:  Cholecystitis. EXAM: MRI ABDOMEN WITH CONTRAST (WITH MRCP) TECHNIQUE: Multiplanar multisequence MR imaging of the abdomen was performed following the administration of intravenous contrast. Heavily T2-weighted images of the biliary  and pancreatic ducts were obtained, and three-dimensional MRCP images were rendered by post processing. CONTRAST:  10mL GADAVIST GADOBUTROL 1 MMOL/ML IV SOLN COMPARISON:  Ultrasound and CT exams from earlier the same day FINDINGS: Lower chest: Dependent atelectasis. Hepatobiliary: No suspicious focal abnormality within the liver parenchyma. Gallbladder is distended with gallbladder wall thickening/edema. Multiple very tiny 1-2 mm stones are seen dependently in the lumen of gallbladder. There is extensive edema/inflammation in the gallbladder fossa extending down under the liver to involve the hepatic flexure of the colon. Edema/inflammation tracks in the hepatoduodenal ligament and retroperitoneal spaces to the descending duodenum. Trace fluid is seen at the inferior tip of the right liver. No intra or extrahepatic biliary duct dilatation. Common bile duct diameter is upper normal at 6 mm in the head of the pancreas. MRCP imaging shows no choledocholithiasis. Pancreas: No focal mass lesion. No dilatation of the main duct. No intraparenchymal cyst. No peripancreatic edema. Spleen:  No splenomegaly. No suspicious focal mass lesion. Adrenals/Urinary Tract: No adrenal nodule or mass. Tiny well-defined homogeneous T2 hyperintensities are seen in both kidneys, incompletely characterized on postcontrast imaging most compatible with simple cyst. No followup imaging is recommended. Stomach/Bowel: Stomach is unremarkable. No gastric wall thickening. No evidence of outlet obstruction. Duodenum is normally positioned as is the ligament of Treitz. No small bowel or colonic dilatation within the visualized abdomen. Vascular/Lymphatic: No abdominal aortic aneurysm. No abdominal aortic atherosclerotic calcification. There is no gastrohepatic or hepatoduodenal ligament lymphadenopathy. No retroperitoneal or mesenteric lymphadenopathy. Other: Trace free fluid in the right upper quadrant as described above. Musculoskeletal: No focal  suspicious marrow enhancement within the visualized bony anatomy. IMPRESSION: 1. Cholelithiasis with gallbladder wall thickening/edema and extensive edema/inflammation in the gallbladder fossa extending down under the liver to involve the hepatic flexure of the colon. Edema/inflammation tracks in the hepatoduodenal ligament and retroperitoneal space to the descending duodenum. Imaging features are compatible with acute cholecystitis. 2. No intra or extrahepatic biliary duct dilatation. Common bile duct  diameter is upper normal at 6 mm in the head of the pancreas. MRCP imaging shows no choledocholithiasis. Electronically Signed   By: Kennith Center M.D.   On: 09/06/2022 07:00   US Abdomen Limited RUQ (LIVER/GB)  Result Date: 09/05/2022 CLINICAL DATA:  Abdominal pain for 3 days. EXAM: ULTRASOUND ABDOMEN LIMITED RIGHT UPPER QUADRANT COMPARISON:  CT [facility] from earlier today FINDINGS: Gallbladder: Gallbladder appears distended and is filled with sludge. Gallbladder wall thickness measures up to 6 mm. No stones identified. A negative sonographic Murphy's sign was reported by the sonographer. Common bile duct: Diameter: 5.0 mm Liver: Increased parenchymal echogenicity. No focal liver abnormality. Portal vein is patent on color Doppler imaging with normal direction of blood flow towards the liver. Other: Exam detail is diminished secondary to overlying bowel gas and patient's body habitus. IMPRESSION: 1. Diminished exam detail due to body habitus and overlying bowel gas. 2. The gallbladder is distended and filled with sludge. There is gallbladder wall thickening measuring up to 6 mm. No stones identified. Negative sonographic Murphy's sign. 3. Despite absence of gallstones and positive sonographic Murphy's sign there is considerable inflammatory change noted within the right upper quadrant surrounding hepatic flexure and gallbladder on the CT from earlier today. Those findings remain suspicious for either acute  cholecystitis or segmental colitis with secondary inflammation of the gallbladder. 4. Increased echogenicity of the liver parenchyma compatible with hepatic steatosis. Electronically Signed   By: Signa Kell M.D.   On: 09/05/2022 11:16   CT Renal Stone Study  Result Date: 09/05/2022 CLINICAL DATA:  Abdominal/flank pain.  Evaluate for kidney stone. EXAM: CT ABDOMEN AND PELVIS WITHOUT CONTRAST TECHNIQUE: Multidetector CT imaging of the abdomen and pelvis was performed following the standard protocol without IV contrast. RADIATION DOSE REDUCTION: This exam was performed according to the departmental dose-optimization program which includes automated exposure control, adjustment of the mA and/or kV according to patient size and/or use of iterative reconstruction technique. COMPARISON:  08/21/2018 FINDINGS: Lower chest: Bilateral pleural thickening and lower lobe subsegmental atelectasis is identified. No pleural fluid or airspace consolidation. Hepatobiliary: No suspicious liver lesion identified. There are inflammatory changes surrounding the gallbladder with possible gallbladder wall thickening (difficult to assess reflecting lack of IV contrast). Focal area of low density adjacent to the gallbladder fossa within segment 4 B is identified and is favored to represent focal fatty deposition, image 23/2. Fat stranding in is also noted extending around the hepatic flexure which exhibits bowel wall edema. No calcified gallstones identified. No signs of bile duct dilatation. Pancreas: Unremarkable. No pancreatic ductal dilatation or surrounding inflammatory changes. Spleen: Normal in size without focal abnormality. Adrenals/Urinary Tract: Normal adrenal glands. Bilateral nephrolithiasis. The largest stone is identified within the right renal pelvis measuring 1.3 by 0.7 cm, image 48/2. There is left hydronephrosis and hydroureter. Stone within the distal ureter measures 5 mm, image 83/2 no right-sided hydronephrosis  or hydroureter. Bladder is normal. Stomach/Bowel: Stomach is within normal limits. The appendix is visualized and is normal. Short segment of wall thickening with surrounding inflammation involves the hepatic flexure adjacent to the gallbladder. Vascular/Lymphatic: Aortic atherosclerosis. No abdominopelvic adenopathy. Reproductive: Prostate is unremarkable. Other: There is a small amount of fluid along the peritoneal reflection at the level of the left iliac fossa, image 78/2. No focal fluid collections identified to suggest an abscess. Musculoskeletal: No acute or significant osseous findings. No acute or suspicious osseous findings. IMPRESSION: 1. Bilateral nephrolithiasis. Left-sided hydronephrosis and hydroureter secondary to 5 mm distal left ureteral calculus. 2.  There are inflammatory changes surrounding the gallbladder. No calcified gallstones identified. If there is a clinical concern for acute cholecystitis consider further evaluation with right upper quadrant ultrasound. 3. Short segment of wall thickening with surrounding inflammation involves the hepatic flexure adjacent to the gallbladder. This is favored to represent secondary inflammation of the colon. Less favored would be acute diverticulitis or short segment colitis with secondary inflammation of the gallbladder. 4.  Aortic Atherosclerosis (ICD10-I70.0). Electronically Signed   By: Signa Kell M.D.   On: 09/05/2022 09:33    Microbiology: Results for orders placed or performed during the hospital encounter of 09/05/22  Aerobic/Anaerobic Culture w Gram Stain (surgical/deep wound)     Status: None (Preliminary result)   Collection Time: 09/06/22 10:20 AM   Specimen: BILE  Result Value Ref Range Status   Specimen Description   Final    BILE Performed at [facility], [address] [place], [place] [address]    Special Requests   Final    Normal Performed at [facility], [address],  [place], [place] [address]    Gram Stain NO WBC SEEN NO ORGANISMS SEEN   Final   Culture   Final    NO GROWTH 2 DAYS Performed at [facility], [address], [place], [place] [address]    Report Status PENDING  Incomplete    Labs: CBC: Recent Labs  Lab 09/05/22 0601 09/06/22 0338 09/07/22 0350 09/08/22 0402  WBC 15.3* 22.6* 16.4* 12.2*  NEUTROABS 11.6*  --   --   --   HGB 13.3 14.4 12.2* 11.0*  HCT 40.1 44.3 38.8* 33.4*  MCV 94.4 95.7 97.7 95.4  PLT 160 183 169 173   Basic Metabolic Panel: Recent Labs  Lab 09/05/22 0601 09/06/22 0338 09/07/22 0350 09/08/22 0402  NA 137 137 138 141  K 4.0 4.5 4.2 3.9  CL 105 104 106 111  CO2 22 23 22  20*  GLUCOSE 135* 130* 117* 109*  BUN 27* 27* 40* 40*  CREATININE 1.87* 1.99* 2.01* 1.74*  CALCIUM 9.0 8.5* 8.1* 8.1*   Liver Function Tests: Recent Labs  Lab 09/05/22 0601 09/06/22 0338 09/07/22 0350 09/08/22 0402  AST 28 33 24 27  ALT 27 28 22 25   ALKPHOS 54 56 48 54  BILITOT 1.0 1.1 1.2 0.6  PROT 7.3 7.0 6.2* 6.0*  ALBUMIN 3.8 3.3* 2.9* 2.7*   CBG: No results for input(s): "GLUCAP" in the last 168 hours.  Discharge time spent: less than 30 minutes.  Signed: Rickey Barbara, MD [facility] 09/08/2022

## 2022-09-09 ENCOUNTER — Other Ambulatory Visit: Payer: Self-pay

## 2022-09-09 ENCOUNTER — Other Ambulatory Visit: Payer: Self-pay | Admitting: Surgery

## 2022-09-09 ENCOUNTER — Other Ambulatory Visit (HOSPITAL_COMMUNITY): Payer: Self-pay

## 2022-09-09 DIAGNOSIS — K810 Acute cholecystitis: Secondary | ICD-10-CM

## 2022-09-10 LAB — AEROBIC/ANAEROBIC CULTURE W GRAM STAIN (SURGICAL/DEEP WOUND)
Gram Stain: NONE SEEN
Special Requests: NORMAL

## 2022-09-11 LAB — AEROBIC/ANAEROBIC CULTURE W GRAM STAIN (SURGICAL/DEEP WOUND): Culture: NO GROWTH

## 2022-09-14 DIAGNOSIS — I10 Essential (primary) hypertension: Secondary | ICD-10-CM | POA: Diagnosis not present

## 2022-09-14 DIAGNOSIS — N200 Calculus of kidney: Secondary | ICD-10-CM | POA: Diagnosis not present

## 2022-09-14 DIAGNOSIS — N179 Acute kidney failure, unspecified: Secondary | ICD-10-CM | POA: Diagnosis not present

## 2022-09-14 DIAGNOSIS — K819 Cholecystitis, unspecified: Secondary | ICD-10-CM | POA: Diagnosis not present

## 2022-10-06 ENCOUNTER — Other Ambulatory Visit: Payer: Self-pay | Admitting: Surgery

## 2022-10-06 DIAGNOSIS — K8000 Calculus of gallbladder with acute cholecystitis without obstruction: Secondary | ICD-10-CM | POA: Diagnosis not present

## 2022-10-08 NOTE — Progress Notes (Addendum)
COVID Vaccine Completed:yes  Date of COVID positive in last 90 days: no  PCP - Nita Sells, MD Cardiologist - n/a  Chest x-ray - n/a EKG - 10/12/22 Epic/chart Stress Test - yes years ago per pt ECHO - years ago per pt Cardiac Cath - n/a Pacemaker/ICD device last checked: n/a Spinal Cord Stimulator: n/a  Bowel Prep - no  Sleep Study - n/a CPAP -   Fasting Blood Sugar - preDM, pt deneis Checks Blood Sugar _____ times a day  Last dose of GLP1 agonist-  N/A GLP1 instructions:  N/A   Last dose of SGLT-2 inhibitors-  N/A SGLT-2 instructions: N/A   Blood Thinner Instructions:  Time Aspirin Instructions:ASA 81, on hold for surgery Last Dose:  Activity level: Can go up a flight of stairs and perform activities of daily living without stopping and without symptoms of chest pain or shortness of breath.  Anesthesia review: left fascicular block on EKG, HTN  Patient denies shortness of breath, fever, cough and chest pain at PAT appointment  Patient verbalized understanding of instructions that were given to them at the PAT appointment. Patient was also instructed that they will need to review over the PAT instructions again at home before surgery.

## 2022-10-08 NOTE — Patient Instructions (Addendum)
SURGICAL WAITING ROOM VISITATION  Patients having surgery or a procedure may have no more than 2 support people in the waiting area - these visitors may rotate.    Children under the age of 6 must have an adult with them who is not the patient.  Due to an increase in RSV and influenza rates and associated hospitalizations, children ages 69 and under may not visit patients in [facility] hospitals.  If the patient needs to stay at the hospital during part of their recovery, the visitor guidelines for inpatient rooms apply. Pre-op nurse will coordinate an appropriate time for 1 support person to accompany patient in pre-op.  This support person may not rotate.    Please refer to the [facility] website for the visitor guidelines for Inpatients (after your surgery is over and you are in a regular room).    Your procedure is scheduled on: 10/21/22   Report to [facility] Main Entrance    Report to admitting at 7:45 AM   Call this number if you have problems the morning of surgery [phone]   Do not eat food :After Midnight.   After Midnight you may have the following liquids until 7:00 AM DAY OF SURGERY  Water Non-Citrus Juices (without pulp, NO RED-Apple, White grape, White cranberry) Black Coffee (NO MILK/CREAM OR CREAMERS, sugar ok)  Clear Tea (NO MILK/CREAM OR CREAMERS, sugar ok) regular and decaf                             Plain Jell-O (NO RED)                                           Fruit ices (not with fruit pulp, NO RED)                                     Popsicles (NO RED)                                                               Sports drinks like Gatorade (NO RED)                 The day of surgery:  Drink ONE (1) Pre-Surgery Clear Ensure at 7:00 AM the morning of surgery. Drink in one sitting. Do not sip.  This drink was given to you during your hospital  pre-op appointment visit. Nothing else to drink after completing the  Pre-Surgery Clear Ensure.           If you have questions, please contact your surgeon's office.   FOLLOW BOWEL PREP AND ANY ADDITIONAL PRE OP INSTRUCTIONS YOU RECEIVED FROM YOUR SURGEON'S OFFICE!!!     Oral Hygiene is also important to reduce your risk of infection.                                    Remember - BRUSH YOUR TEETH THE MORNING OF SURGERY WITH YOUR REGULAR TOOTHPASTE  DENTURES WILL  BE REMOVED PRIOR TO SURGERY PLEASE DO NOT APPLY "Poly grip" OR ADHESIVES!!!   Take these medicines the morning of surgery with A SIP OF WATER: Pravastatin, Tylenol                              You may not have any metal on your body including jewelry, and body piercing             Do not wear lotions, powders, cologne, or deodorant              Men may shave face and neck.   Do not bring valuables to the hospital. [facility] IS NOT             RESPONSIBLE   FOR VALUABLES.   Contacts, glasses, dentures or bridgework may not be worn into surgery.   Bring small overnight bag day of surgery.   DO NOT BRING YOUR HOME MEDICATIONS TO THE HOSPITAL. PHARMACY WILL DISPENSE MEDICATIONS LISTED ON YOUR MEDICATION LIST TO YOU DURING YOUR ADMISSION IN THE HOSPITAL!    Special Instructions: Bring a copy of your healthcare power of attorney and living will documents the day of surgery if you haven't scanned them before.              Please read over the following fact sheets you were given: IF YOU HAVE QUESTIONS ABOUT YOUR PRE-OP INSTRUCTIONS PLEASE CALL [phone] Contras    If you received a COVID test during your pre-op visit  it is requested that you wear a mask when out in public, stay away from anyone that may not be feeling well and notify your surgeon if you develop symptoms. If you test positive for Covid or have been in contact with anyone that has tested positive in the last 10 days please notify you surgeon.    [facility] - Preparing for Surgery Before surgery, you can play an important role.  Because skin is not  sterile, your skin needs to be as free of germs as possible.  You can reduce the number of germs on your skin by washing with CHG (chlorahexidine gluconate) soap before surgery.  CHG is an antiseptic cleaner which kills germs and bonds with the skin to continue killing germs even after washing. Please DO NOT use if you have an allergy to CHG or antibacterial soaps.  If your skin becomes reddened/irritated stop using the CHG and inform your nurse when you arrive at Short Stay. Do not shave (including legs and underarms) for at least 48 hours prior to the first CHG shower.  You may shave your face/neck.  Please follow these instructions carefully:  1.  Shower with CHG Soap the night before surgery and the  morning of surgery.  2.  If you choose to wash your hair, wash your hair first as usual with your normal  shampoo.  3.  After you shampoo, rinse your hair and body thoroughly to remove the shampoo.                             4.  Use CHG as you would any other liquid soap.  You can apply chg directly to the skin and wash.  Gently with a scrungie or clean washcloth.  5.  Apply the CHG Soap to your body ONLY FROM THE NECK DOWN.   Do   not use on face/  open                           Wound or open sores. Avoid contact with eyes, ears mouth and   genitals (private parts).                       Wash face,  Genitals (private parts) with your normal soap.             6.  Wash thoroughly, paying special attention to the area where your    surgery  will be performed.  7.  Thoroughly rinse your body with warm water from the neck down.  8.  DO NOT shower/wash with your normal soap after using and rinsing off the CHG Soap.                9.  Pat yourself dry with a clean towel.            10.  Wear clean pajamas.            11.  Place clean sheets on your bed the night of your first shower and do not  sleep with pets. Day of Surgery : Do not apply any lotions/deodorants the morning of surgery.  Please wear  clean clothes to the hospital/surgery center.  FAILURE TO FOLLOW THESE INSTRUCTIONS MAY RESULT IN THE CANCELLATION OF YOUR SURGERY  PATIENT SIGNATURE_________________________________  NURSE SIGNATURE__________________________________  ________________________________________________________________________

## 2022-10-12 ENCOUNTER — Other Ambulatory Visit: Payer: Self-pay

## 2022-10-12 ENCOUNTER — Encounter (HOSPITAL_COMMUNITY)
Admission: RE | Admit: 2022-10-12 | Discharge: 2022-10-12 | Disposition: A | Discharge status: Home / Self Care | Payer: Medicare Other | Source: Ambulatory Visit | Attending: Surgery | Admitting: Surgery

## 2022-10-12 ENCOUNTER — Encounter (HOSPITAL_COMMUNITY): Payer: Self-pay

## 2022-10-12 VITALS — BP 146/95 | HR 65 | Temp 98.3°F | Resp 16 | Ht 67.0 in | Wt 203.0 lb

## 2022-10-12 DIAGNOSIS — I498 Other specified cardiac arrhythmias: Secondary | ICD-10-CM | POA: Diagnosis not present

## 2022-10-12 DIAGNOSIS — Z01818 Encounter for other preprocedural examination: Secondary | ICD-10-CM | POA: Diagnosis not present

## 2022-10-12 DIAGNOSIS — I444 Left anterior fascicular block: Secondary | ICD-10-CM | POA: Diagnosis not present

## 2022-10-12 DIAGNOSIS — I10 Essential (primary) hypertension: Secondary | ICD-10-CM | POA: Insufficient documentation

## 2022-10-12 HISTORY — DX: Pneumonia, unspecified organism: J18.9

## 2022-10-12 LAB — CBC
HCT: 46.3 % (ref 39.0–52.0)
Hemoglobin: 14.7 g/dL (ref 13.0–17.0)
MCH: 30.4 pg (ref 26.0–34.0)
MCHC: 31.7 g/dL (ref 30.0–36.0)
MCV: 95.7 fL (ref 80.0–100.0)
Platelets: 177 10*3/uL (ref 150–400)
RBC: 4.84 MIL/uL (ref 4.22–5.81)
RDW: 13.4 % (ref 11.5–15.5)
WBC: 7.7 10*3/uL (ref 4.0–10.5)
nRBC: 0 % (ref 0.0–0.2)

## 2022-10-12 LAB — BASIC METABOLIC PANEL
Anion gap: 10 (ref 5–15)
BUN: 20 mg/dL (ref 8–23)
CO2: 24 mmol/L (ref 22–32)
Calcium: 9.5 mg/dL (ref 8.9–10.3)
Chloride: 108 mmol/L (ref 98–111)
Creatinine, Ser: 1.11 mg/dL (ref 0.61–1.24)
GFR, Estimated: >60 mL/min (ref >60)
Glucose, Bld: 101 mg/dL — ABNORMAL HIGH (ref 70–99)
Potassium: 4.1 mmol/L (ref 3.5–5.1)
Sodium: 142 mmol/L (ref 135–145)

## 2022-10-15 NOTE — Anesthesia Preprocedure Evaluation (Addendum)
Anesthesia Evaluation  Patient identified by MRN, date of birth, ID band Patient awake    Reviewed: Allergy & Precautions, H&P , NPO status , Patient's Chart, lab work & pertinent test results  Airway Mallampati: II  TM Distance: >3 FB Neck ROM: Full    Dental no notable dental hx. (+) Teeth Intact, Dental Advisory Given   Pulmonary former smoker   Pulmonary exam normal breath sounds clear to auscultation       Cardiovascular hypertension, Pt. on medications  Rhythm:Regular Rate:Normal     Neuro/Psych negative neurological ROS  negative psych ROS   GI/Hepatic negative GI ROS, Neg liver ROS,,,  Endo/Other  negative endocrine ROS    Renal/GU negative Renal ROS  negative genitourinary   Musculoskeletal   Abdominal   Peds  Hematology negative hematology ROS (+)   Anesthesia Other Findings   Reproductive/Obstetrics negative OB ROS                             Anesthesia Physical Anesthesia Plan  ASA: 2  Anesthesia Plan: General   Post-op Pain Management: Tylenol PO (pre-op)*   Induction: Intravenous  PONV Risk Score and Plan: 3 and Ondansetron, Dexamethasone and Treatment may vary due to age or medical condition  Airway Management Planned: Oral ETT  Additional Equipment:   Intra-op Plan:   Post-operative Plan: Extubation in OR  Informed Consent: I have reviewed the patients History and Physical, chart, labs and discussed the procedure including the risks, benefits and alternatives for the proposed anesthesia with the patient or authorized representative who has indicated his/her understanding and acceptance.     Dental advisory given  Plan Discussed with: CRNA  Anesthesia Plan Comments: (See PAT note from 7/29 by Sherlie Ban PA-C )        Anesthesia Quick Evaluation

## 2022-10-15 NOTE — Progress Notes (Signed)
Choose an anesthesia record to view details        DISCUSSION: Frank Gilmore is a 72 yo male who presents to PAT prior to laparoscopic, possible open cholecystectomy on 10/21/22 with Dr. Magnus Ivan. PMH significant for former smoking, hx of kidney stones, HTN, HLD.   No prior anesthesia complications  Patient was admitted from 09/05/22-09/08/22 after presenting to the ED with R sided abdominal pain. Was found to have cholecystitis and a left sided obstructing renal stone. He underwent perc chole tube placement on 6/23 by IR. His kidney stone was treated medically.   He is now presenting for cholecystectomy. His AKI has resolved on PAT labs. BP is controlled. He denies CP/SOB.  Anticipate patient can proceed.  VS: BP (!) 146/95   Pulse 65   Temp 36.8 C (Oral)   Resp 16   Ht 5\' 7"  (1.702 m)   Wt 92.1 kg   SpO2 97%   BMI 31.79 kg/m   PROVIDERS: Benita Stabile, MD   LABS: Labs reviewed: Acceptable for surgery. (all labs ordered are listed, but only abnormal results are displayed)  Labs Reviewed  BASIC METABOLIC PANEL - Abnormal; Notable for the following components:      Result Value   Glucose, Bld 101 (*)    All other components within normal limits  CBC     IMAGES:  CT Renal 09/07/22:  IMPRESSION: Distal left ureteral stone measuring 5 mm causing hydronephrosis. There has been some distal migration when compared with the prior exam.   Bilateral nonobstructing renal calculi stable from the prior study.   Changes consistent with prior cholecystostomy tube placement.   EKG 10/12/22  NSR with sinus arrhythmia, rate 79 LAFB  CV: n/a  Past Medical History:  Diagnosis Date   Hyperlipidemia    Hypertension    Kidney stone    Pneumonia     Past Surgical History:  Procedure Laterality Date   IR PERC CHOLECYSTOSTOMY  09/06/2022   LITHOTRIPSY     WISDOM TOOTH EXTRACTION      MEDICATIONS:  acetaminophen (TYLENOL) 500 MG tablet   aspirin EC 81 MG tablet    hydrochlorothiazide (HYDRODIURIL) 12.5 MG tablet   losartan (COZAAR) 100 MG tablet   Multiple Vitamins-Minerals (MULTIVITAMINS THER. W/MINERALS) TABS   pravastatin (PRAVACHOL) 20 MG tablet   Sodium Chloride Flush (NORMAL SALINE FLUSH) 0.9 % SOLN   No current facility-administered medications for this encounter.   Marcille Blanco [facility] Surgical Short Stay/[facility] Phone [phone] 10/15/2022 10:34 AM

## 2022-10-19 ENCOUNTER — Other Ambulatory Visit (HOSPITAL_COMMUNITY): Payer: 59

## 2022-10-20 NOTE — H&P (Signed)
PROVIDER: Wayne Both, MD  MRN: [id number] DOB: 23-Nov-1950  Subjective   Chief Complaint: New Consultation (acute cholecystitis with cholelithiasis -)   History of Present Illness: Frank Gilmore is a 72 y.o. male who is seen today for for a follow-up from hospital admission. He initially presented on June 22 with an obstructing stone in his left ureter. He had been passing multiple stones and then started having right upper quadrant abdominal pain as well. He was also found to have severe acute cholecystitis. He ended up undergoing an MRCP which showed the gallbladder wall thickening and extensive edema involving the right upper quadrant so decision was made to proceed with a percutaneous cholecystostomy tube which was placed by interventional radiology. He was eventually discharged home with a drain in place and has been doing very well. He ended up passing the kidney stone without the need for intervention from neurology. He reports he is walking daily and has a good appetite. He has had no problems taking care of the drain.    Review of Systems: A complete review of systems was obtained from the patient. I have reviewed this information and discussed as appropriate with the patient. See HPI as well for other ROS.  ROS   Medical History: Past Medical History:  Diagnosis Date  Hyperlipidemia  Hypertension   There is no problem list on file for this patient.  History reviewed. No pertinent surgical history.   No Known Allergies  Current Outpatient Medications on File Prior to Visit  Medication Sig Dispense Refill  aspirin 81 MG EC tablet Take 81 mg by mouth once daily  pravastatin (PRAVACHOL) 20 MG tablet Take 1 tablet by mouth once daily  tamsulosin (FLOMAX) 0.4 mg capsule TAKE 1 CAPSULE BY MOUTH EVERY DAY AS DIRECTED   No current facility-administered medications on file prior to visit.   Family History  Problem Relation Age of Onset  Coronary Artery Disease  (Blocked arteries around heart) Father    Social History   Tobacco Use  Smoking Status Former  Types: Cigarettes  Start date: 2001  Smokeless Tobacco Never    Social History   Socioeconomic History  Marital status: Married  Tobacco Use  Smoking status: Former  Types: Cigarettes  Start date: 2001  Smokeless tobacco: Never  Substance and Sexual Activity  Alcohol use: Not Currently  Drug use: Never   Social Determinants of Health   Food Insecurity: No Food Insecurity (09/07/2022)  Received from [facility]  Hunger Vital Sign  Worried About Running Out of Food in the Last Year: Never true  Ran Out of Food in the Last Year: Never true  Transportation Needs: No Transportation Needs (09/07/2022)  Received from [facility] - Transportation  Lack of Transportation (Medical): No  Lack of Transportation (Non-Medical): No   Objective:   Vitals:   BP: (!) 162/90  Pulse: 102  Temp: 36.7 C (98 F)  SpO2: 98%  Weight: 94.3 kg (208 lb)  Height: 170.2 cm (5\' 7" )   Body mass index is 32.58 kg/m.  Physical Exam   He appears well on exam  His abdomen is soft and nontender. The percutaneous cholecystostomy tube is in place and draining bile.   Labs, Imaging and Diagnostic Testing:  I reviewed his most recent laboratory data and notes in the electronic medical records  Assessment and Plan:   Diagnoses and all orders for this visit:  Calculus of gallbladder with acute cholecystitis without obstruction    At this  point, he has been 6 weeks out since the drain was placed. He is now ready to proceed with a cholecystectomy. I again discussed proceeding with a laparoscopic cholecystectomy. We discussed the risks which includes but is not limited to bleeding, infection, injury to surrounding structures, the need to convert to an open procedure, bile duct injury, bile leak, the need for further drains, postoperative recovery, cardiopulmonary issues, etc. surgery will be  scheduled for 2 weeks from now.

## 2022-10-21 ENCOUNTER — Ambulatory Visit (HOSPITAL_COMMUNITY): Payer: Medicare Other | Admitting: Medical

## 2022-10-21 ENCOUNTER — Other Ambulatory Visit: Payer: Self-pay

## 2022-10-21 ENCOUNTER — Encounter (HOSPITAL_COMMUNITY): Payer: Self-pay | Admitting: Surgery

## 2022-10-21 ENCOUNTER — Encounter (HOSPITAL_COMMUNITY)
Admission: RE | Disposition: A | Discharge status: Home / Self Care | Payer: Self-pay | Source: Home / Self Care | Attending: Surgery

## 2022-10-21 ENCOUNTER — Ambulatory Visit (HOSPITAL_COMMUNITY)
Admission: RE | Admit: 2022-10-21 | Discharge: 2022-10-21 | Disposition: A | Discharge status: Home / Self Care | Payer: Medicare Other | Attending: Surgery | Admitting: Surgery

## 2022-10-21 ENCOUNTER — Ambulatory Visit (HOSPITAL_BASED_OUTPATIENT_CLINIC_OR_DEPARTMENT_OTHER): Payer: Medicare Other | Admitting: Registered Nurse

## 2022-10-21 DIAGNOSIS — I10 Essential (primary) hypertension: Secondary | ICD-10-CM | POA: Diagnosis not present

## 2022-10-21 DIAGNOSIS — K8066 Calculus of gallbladder and bile duct with acute and chronic cholecystitis without obstruction: Secondary | ICD-10-CM | POA: Diagnosis not present

## 2022-10-21 DIAGNOSIS — Z87891 Personal history of nicotine dependence: Secondary | ICD-10-CM | POA: Insufficient documentation

## 2022-10-21 DIAGNOSIS — K8012 Calculus of gallbladder with acute and chronic cholecystitis without obstruction: Secondary | ICD-10-CM | POA: Diagnosis not present

## 2022-10-21 DIAGNOSIS — K810 Acute cholecystitis: Secondary | ICD-10-CM | POA: Diagnosis not present

## 2022-10-21 DIAGNOSIS — K8010 Calculus of gallbladder with chronic cholecystitis without obstruction: Secondary | ICD-10-CM | POA: Diagnosis not present

## 2022-10-21 HISTORY — PX: CHOLECYSTECTOMY: SHX55

## 2022-10-21 SURGERY — LAPAROSCOPIC CHOLECYSTECTOMY
Anesthesia: General

## 2022-10-21 MED ORDER — FENTANYL CITRATE (PF) 100 MCG/2ML IJ SOLN
INTRAMUSCULAR | Status: DC | PRN
  Administered 2022-10-21 (×2): 50 ug via INTRAVENOUS

## 2022-10-21 MED ORDER — KETOROLAC TROMETHAMINE 30 MG/ML IJ SOLN
INTRAMUSCULAR | Status: DC | PRN
  Administered 2022-10-21: 15 mg via INTRAVENOUS

## 2022-10-21 MED ORDER — CHLORHEXIDINE GLUCONATE 0.12 % MT SOLN
15.0000 mL | Freq: Once | OROMUCOSAL | Status: AC
  Administered 2022-10-21: 15 mL via OROMUCOSAL

## 2022-10-21 MED ORDER — CEFAZOLIN SODIUM-DEXTROSE 2-4 GM/100ML-% IV SOLN
2.0000 g | INTRAVENOUS | Status: AC
  Administered 2022-10-21: 2 g via INTRAVENOUS
  Filled 2022-10-21: qty 100

## 2022-10-21 MED ORDER — DEXAMETHASONE SODIUM PHOSPHATE 10 MG/ML IJ SOLN
INTRAMUSCULAR | Status: DC | PRN
  Administered 2022-10-21: 4 mg via INTRAVENOUS

## 2022-10-21 MED ORDER — HYDROMORPHONE HCL 1 MG/ML IJ SOLN
0.2500 mg | INTRAMUSCULAR | Status: DC | PRN
  Administered 2022-10-21: 0.5 mg via INTRAVENOUS

## 2022-10-21 MED ORDER — CHLORHEXIDINE GLUCONATE CLOTH 2 % EX PADS: 6.0000 | MEDICATED_PAD | Freq: Once | CUTANEOUS | Status: DC

## 2022-10-21 MED ORDER — PHENYLEPHRINE 80 MCG/ML (10ML) SYRINGE FOR IV PUSH (FOR BLOOD PRESSURE SUPPORT)
PREFILLED_SYRINGE | INTRAVENOUS | Status: AC
  Filled 2022-10-21: qty 10

## 2022-10-21 MED ORDER — EPHEDRINE 5 MG/ML INJ
INTRAVENOUS | Status: AC
  Filled 2022-10-21: qty 5

## 2022-10-21 MED ORDER — HYDROMORPHONE HCL 1 MG/ML IJ SOLN
INTRAMUSCULAR | Status: AC
  Filled 2022-10-21: qty 1

## 2022-10-21 MED ORDER — SUGAMMADEX SODIUM 200 MG/2ML IV SOLN
INTRAVENOUS | Status: DC | PRN
  Administered 2022-10-21 (×2): 200 mg via INTRAVENOUS

## 2022-10-21 MED ORDER — BUPIVACAINE HCL (PF) 0.5 % IJ SOLN
INTRAMUSCULAR | Status: DC | PRN
  Administered 2022-10-21: 20 mL

## 2022-10-21 MED ORDER — ACETAMINOPHEN 500 MG PO TABS
1000.0000 mg | ORAL_TABLET | ORAL | Status: AC
  Administered 2022-10-21: 1000 mg via ORAL
  Filled 2022-10-21: qty 2

## 2022-10-21 MED ORDER — SUCCINYLCHOLINE CHLORIDE 200 MG/10ML IV SOSY
PREFILLED_SYRINGE | INTRAVENOUS | Status: DC | PRN
  Administered 2022-10-21: 100 mg via INTRAVENOUS

## 2022-10-21 MED ORDER — ONDANSETRON HCL 4 MG/2ML IJ SOLN
INTRAMUSCULAR | Status: DC | PRN
  Administered 2022-10-21: 4 mg via INTRAVENOUS

## 2022-10-21 MED ORDER — ENSURE PRE-SURGERY PO LIQD
296.0000 mL | Freq: Once | ORAL | Status: DC
  Filled 2022-10-21: qty 296

## 2022-10-21 MED ORDER — PHENYLEPHRINE 80 MCG/ML (10ML) SYRINGE FOR IV PUSH (FOR BLOOD PRESSURE SUPPORT)
PREFILLED_SYRINGE | INTRAVENOUS | Status: DC | PRN
  Administered 2022-10-21: 80 ug via INTRAVENOUS

## 2022-10-21 MED ORDER — SUCCINYLCHOLINE CHLORIDE 200 MG/10ML IV SOSY
PREFILLED_SYRINGE | INTRAVENOUS | Status: AC
  Filled 2022-10-21: qty 10

## 2022-10-21 MED ORDER — LIDOCAINE HCL (PF) 2 % IJ SOLN
INTRAMUSCULAR | Status: AC
  Filled 2022-10-21: qty 5

## 2022-10-21 MED ORDER — ROCURONIUM BROMIDE 10 MG/ML (PF) SYRINGE
PREFILLED_SYRINGE | INTRAVENOUS | Status: DC | PRN
  Administered 2022-10-21: 40 mg via INTRAVENOUS

## 2022-10-21 MED ORDER — MIDAZOLAM HCL 2 MG/2ML IJ SOLN
INTRAMUSCULAR | Status: AC
  Filled 2022-10-21: qty 2

## 2022-10-21 MED ORDER — PROPOFOL 10 MG/ML IV BOLUS
INTRAVENOUS | Status: AC
  Filled 2022-10-21: qty 20

## 2022-10-21 MED ORDER — ORAL CARE MOUTH RINSE: 15.0000 mL | Freq: Once | OROMUCOSAL | Status: AC

## 2022-10-21 MED ORDER — LACTATED RINGERS IR SOLN
Status: DC | PRN
Start: 2022-10-21 — End: 2022-10-21
  Administered 2022-10-21: 1000 mL

## 2022-10-21 MED ORDER — FENTANYL CITRATE (PF) 100 MCG/2ML IJ SOLN
INTRAMUSCULAR | Status: AC
  Filled 2022-10-21: qty 2

## 2022-10-21 MED ORDER — GLYCOPYRROLATE 0.2 MG/ML IJ SOLN
INTRAMUSCULAR | Status: AC
  Filled 2022-10-21: qty 1

## 2022-10-21 MED ORDER — DEXMEDETOMIDINE HCL IN NACL 400 MCG/100ML IV SOLN
INTRAVENOUS | Status: DC | PRN
Start: 2022-10-21 — End: 2022-10-21
  Administered 2022-10-21: 8 ug via INTRAVENOUS
  Administered 2022-10-21: 4 ug via INTRAVENOUS

## 2022-10-21 MED ORDER — LACTATED RINGERS IV SOLN: INTRAVENOUS | Status: DC

## 2022-10-21 MED ORDER — PROPOFOL 10 MG/ML IV BOLUS
INTRAVENOUS | Status: DC | PRN
  Administered 2022-10-21: 150 mg via INTRAVENOUS

## 2022-10-21 MED ORDER — 0.9 % SODIUM CHLORIDE (POUR BTL) OPTIME
TOPICAL | Status: DC | PRN
  Administered 2022-10-21: 1000 mL

## 2022-10-21 MED ORDER — BUPIVACAINE HCL (PF) 0.5 % IJ SOLN
INTRAMUSCULAR | Status: AC
  Filled 2022-10-21: qty 30

## 2022-10-21 MED ORDER — TRAMADOL HCL 50 MG PO TABS: 50.0000 mg | ORAL_TABLET | Freq: Four times a day (QID) | ORAL | 0 refills | Status: DC | PRN

## 2022-10-21 MED ORDER — LIDOCAINE 2% (20 MG/ML) 5 ML SYRINGE
INTRAMUSCULAR | Status: DC | PRN
  Administered 2022-10-21: 60 mg via INTRAVENOUS
  Administered 2022-10-21: 40 mg via INTRAVENOUS

## 2022-10-21 MED ORDER — MIDAZOLAM HCL 2 MG/2ML IJ SOLN
INTRAMUSCULAR | Status: DC | PRN
  Administered 2022-10-21: 1 mg via INTRAVENOUS

## 2022-10-21 MED ORDER — EPHEDRINE SULFATE-NACL 50-0.9 MG/10ML-% IV SOSY
PREFILLED_SYRINGE | INTRAVENOUS | Status: DC | PRN
Start: 2022-10-21 — End: 2022-10-21
  Administered 2022-10-21 (×2): 10 mg via INTRAVENOUS

## 2022-10-21 MED ORDER — GLYCOPYRROLATE PF 0.2 MG/ML IJ SOSY
PREFILLED_SYRINGE | INTRAMUSCULAR | Status: DC | PRN
Start: 2022-10-21 — End: 2022-10-21
  Administered 2022-10-21: .1 mg via INTRAVENOUS

## 2022-10-21 SURGICAL SUPPLY — 35 items
ADH SKN CLS APL DERMABOND .7 (GAUZE/BANDAGES/DRESSINGS) ×1
APL PRP STRL LF DISP 70% ISPRP (MISCELLANEOUS) ×1
APPLIER CLIP 5 13 M/L LIGAMAX5 (MISCELLANEOUS) ×1
APR CLP MED LRG 5 ANG JAW (MISCELLANEOUS) ×1
BAG COUNTER SPONGE SURGICOUNT (BAG) IMPLANT
BAG SPNG CNTER NS LX DISP (BAG)
CABLE HIGH FREQUENCY MONO STRZ (ELECTRODE) ×1 IMPLANT
CHLORAPREP W/TINT 26 (MISCELLANEOUS) ×1 IMPLANT
CLIP APPLIE 5 13 M/L LIGAMAX5 (MISCELLANEOUS) ×1 IMPLANT
COVER MAYO STAND XLG (MISCELLANEOUS) ×1 IMPLANT
DERMABOND ADVANCED .7 DNX12 (GAUZE/BANDAGES/DRESSINGS) ×1 IMPLANT
DRAPE C-ARM 42X120 X-RAY (DRAPES) IMPLANT
ELECT REM PT RETURN 15FT ADLT (MISCELLANEOUS) ×1 IMPLANT
GLOVE BIO SURGEON STRL SZ7.5 (GLOVE) ×1 IMPLANT
GOWN STRL REUS W/ TWL XL LVL3 (GOWN DISPOSABLE) ×2 IMPLANT
GOWN STRL REUS W/TWL XL LVL3 (GOWN DISPOSABLE) ×2
HEMOSTAT SURGICEL 4X8 (HEMOSTASIS) IMPLANT
IRRIG SUCT STRYKERFLOW 2 WTIP (MISCELLANEOUS) ×1
IRRIGATION SUCT STRKRFLW 2 WTP (MISCELLANEOUS) ×1 IMPLANT
KIT BASIN OR (CUSTOM PROCEDURE TRAY) ×1 IMPLANT
KIT TURNOVER KIT A (KITS) IMPLANT
PENCIL SMOKE EVACUATOR (MISCELLANEOUS) IMPLANT
SCISSORS LAP 5X35 DISP (ENDOMECHANICALS) ×1 IMPLANT
SET CHOLANGIOGRAPH MIX (MISCELLANEOUS) IMPLANT
SET TUBE SMOKE EVAC HIGH FLOW (TUBING) ×1 IMPLANT
SLEEVE Z-THREAD 5X100MM (TROCAR) ×2 IMPLANT
SPIKE FLUID TRANSFER (MISCELLANEOUS) ×1 IMPLANT
SUT MNCRL AB 4-0 PS2 18 (SUTURE) ×1 IMPLANT
SYS BAG RETRIEVAL 10MM (BASKET) ×1
SYSTEM BAG RETRIEVAL 10MM (BASKET) ×1 IMPLANT
TOWEL OR 17X26 10 PK STRL BLUE (TOWEL DISPOSABLE) ×1 IMPLANT
TOWEL OR NON WOVEN STRL DISP B (DISPOSABLE) ×1 IMPLANT
TRAY LAPAROSCOPIC (CUSTOM PROCEDURE TRAY) ×1 IMPLANT
TROCAR BALLN 12MMX100 BLUNT (TROCAR) ×1 IMPLANT
TROCAR Z-THREAD OPTICAL 5X100M (TROCAR) ×1 IMPLANT

## 2022-10-21 NOTE — Op Note (Signed)
LAPAROSCOPIC CHOLECYSTECTOMY  Procedure Note  Frank Gilmore 10/21/2022   Pre-op Diagnosis:CHRONIC CHOLECYSTITIS WITH CHOLELITHIASIS     Post-op Diagnosis: SAME  Procedure(s): LAPAROSCOPIC CHOLECYSTECTOMY  Surgeon(s): Abigail Miyamoto, MD  Anesthesia: General  Staff:  Relief Circulator: Martina Sinner, RN Scrub Person: Tilden Fossa; Selena Lesser, RN  Estimated Blood Loss: less than 50 mL               Specimens: SENT TO PATH  Indications: This is a 72 year old gentleman who presented with acute severe cholecystitis.  At the time of admission he underwent a percutaneous cholecystostomy by interventional radiology on June 23.  He has now completely recovered and he wishes to proceed with a laparoscopic cholecystectomy  Findings: The patient was found to have significant chronic cholecystitis with a contracted gallbladder.  Procedure: The patient was brought to the operating identifies the correct patient.  He was placed upon on the operating table and general anesthesia was induced.  His abdomen was then prepped and draped in usual sterile fashion.  I made a vertical incision above the umbilicus with a scalpel.  I then carried this down to the fascia which was opened and included a small umbilical hernia.  I placed a 0 Vicryl pursestring suture around the fascial opening.  The [facility] port was placed the opening and insufflation of the abdomen was begun.  I placed a 5 mm trocar in the patient's epigastrium and 2 in the right upper quadrant all under direct vision.  The patient had omentum stuck over the gallbladder and the drain could be visualized coming through the abdominal wall into the gallbladder down.  I was able to free the omentum off of the top of the gallbladder which was contracted and chronically inflamed.  I removed the percutaneous cholecystostomy tube completely.  I was able to grasp the gallbladder and retracted above the liver bed.  The base of the gallbladder was  very woody and the area was chronically inflamed.  With slow dissection both lateral and medial the gallbladder and at the base of the gallbladder I was finally able to dissect out the cystic duct and achieved a critical window around it.  I clipped it proximally 3 times and then once distally and transected with laparoscopic scissors.  The cystic artery was identified clipped proximally distally and transected as well.  The gallbladder was slowly dissected free from the liver bed with electrocautery.  The gallbladder was opened during the dissection and several small stones spilled out.  I was able to suction the stones away.  I then completed the removal of the gallbladder from the liver bed.  The gallbladder was then placed in an Endosac and removed through the incision at the umbilicus.  I then irrigated the right upper quadrant with copious amounts of normal saline.  Again hemostasis appeared to be achieved.  At this point all ports were removed under direct vision and the abdomen was deflated.  The 0 Vicryl then blocks was tied in place closing the fascial defect I placed another figure-of-eight 0 Vicryl suture at the fascial opening as well.  All incisions were anesthetized with Marcaine and closed with 4-0 Monocryl sutures.  Dermabond was then applied.  The patient tolerated the procedure well.  All the counts were correct at the end of the procedure.  The patient was then extubated in the operating room and taken in stable condition to the recovery room.          Abigail Miyamoto  Date: 10/21/2022  Time: 11:23 AM

## 2022-10-21 NOTE — Anesthesia Procedure Notes (Signed)
Procedure Name: Intubation Date/Time: 10/21/2022 10:12 AM  Performed by: Norva Pavlov, CRNAPre-anesthesia Checklist: Patient identified, Emergency Drugs available, Suction available and Patient being monitored Patient Re-evaluated:Patient Re-evaluated prior to induction Oxygen Delivery Method: Circle system utilized Preoxygenation: Pre-oxygenation with 100% oxygen Induction Type: IV induction Ventilation: Mask ventilation without difficulty Laryngoscope Size: Robertshaw, Mac and 4 Grade View: Grade I Tube type: Oral Tube size: 7.5 mm Number of attempts: 1 Airway Equipment and Method: Stylet and Oral airway Placement Confirmation: ETT inserted through vocal cords under direct vision, positive ETCO2 and breath sounds checked- equal and bilateral Secured at: 22 cm Tube secured with: Tape Dental Injury: Teeth and Oropharynx as per pre-operative assessment

## 2022-10-21 NOTE — Transfer of Care (Signed)
Immediate Anesthesia Transfer of Care Note  Patient: Frank Gilmore  Procedure(s) Performed: Procedure(s) (LRB): LAPAROSCOPIC CHOLECYSTECTOMY (N/A)  Patient Location: PACU  Anesthesia Type: General  Level of Consciousness: awake, alert  and oriented  Airway & Oxygen Therapy: Patient Spontanous Breathing and Patient connected to face mask oxygen  Post-op Assessment: Report given to PACU RN and Post -op Vital signs reviewed and stable  Post vital signs: Reviewed and stable  Complications: No apparent anesthesia complications  Last Vitals:  Vitals Value Taken Time  BP 139/87 10/21/22 1135  Temp    Pulse 95 10/21/22 1137  Resp 13 10/21/22 1137  SpO2 100 % 10/21/22 1137  Vitals shown include unfiled device data.  Last Pain:  Vitals:   10/21/22 0743  TempSrc: Oral         Complications: No notable events documented.

## 2022-10-21 NOTE — Discharge Instructions (Signed)
[facility] ______[facility] LAPAROSCOPIC SURGERY: POST OP INSTRUCTIONS Always review your discharge instruction sheet given to you by the facility where your surgery was performed. IF YOU HAVE DISABILITY OR FAMILY LEAVE FORMS, YOU MUST BRING THEM TO THE OFFICE FOR PROCESSING.   DO NOT GIVE THEM TO YOUR DOCTOR.  A prescription for pain medication may be given to you upon discharge.  Take your pain medication as prescribed, if needed.  If narcotic pain medicine is not needed, then you may take acetaminophen (Tylenol) or ibuprofen (Advil) as needed. Take your usually prescribed medications unless otherwise directed. If you need a refill on your pain medication, please contact your pharmacy.  They will contact our office to request authorization. Prescriptions will not be filled after 5pm or on week-ends. You should follow a light diet the first few days after arrival home, such as soup and crackers, etc.  Be sure to include lots of fluids daily. Most patients will experience some swelling and bruising in the area of the incisions.  Ice packs will help.  Swelling and bruising can take several days to resolve.  It is common to experience some constipation if taking pain medication after surgery.  Increasing fluid intake and taking a stool softener (such as Colace) will usually help or prevent this problem from occurring.  A mild laxative (Milk of Magnesia or Miralax) should be taken according to package instructions if there are no bowel movements after 48 hours. Unless discharge instructions indicate otherwise, you may remove your bandages 24-48 hours after surgery, and you may shower at that time.  You may have steri-strips (small skin tapes) in place directly over the incision.  These strips should be left on the skin for 7-10 days.  If your surgeon used skin glue on the incision, you may shower in 24 hours.  The glue will flake off over the next 2-3 weeks.  Any sutures or staples will be  removed at the office during your follow-up visit. ACTIVITIES:  You may resume regular (light) daily activities beginning the next day--such as daily self-care, walking, climbing stairs--gradually increasing activities as tolerated.  You may have sexual intercourse when it is comfortable.  Refrain from any heavy lifting or straining until approved by your doctor. You may drive when you are no longer taking prescription pain medication, you can comfortably wear a seatbelt, and you can safely maneuver your car and apply brakes. RETURN TO WORK:  __________________________________________________________ Bonita Quin should see your doctor in the office for a follow-up appointment approximately 2-3 weeks after your surgery.  Make sure that you call for this appointment within a day or two after you arrive home to insure a convenient appointment time. OTHER INSTRUCTIONS: OK TO SHOWER STARTING TOMORROW ICE PACK, TYLENOL, AND IBUPROFEN ALSO FOR PAIN NO LIFTING MORE THAN 15 POUNDS FOR 2 TO 3 WEEKS __________________________________________________________________________________________________________________________ __________________________________________________________________________________________________________________________ WHEN TO CALL YOUR DOCTOR: Fever over 101.0 Inability to urinate Continued bleeding from incision. Increased pain, redness, or drainage from the incision. Increasing abdominal pain  The clinic staff is available to answer your questions during regular business hours.  Please don't hesitate to call and ask to speak to one of the nurses for clinical concerns.  If you have a medical emergency, go to the nearest emergency room or call 911.  A surgeon from [facility] is always on call at the hospital. [address], [place], [place]  [address] ? [address] [address], [place], [place]   [address] [phone] ?  [phone] ? FAX [phone] Web site:  [email or web address]

## 2022-10-21 NOTE — Anesthesia Postprocedure Evaluation (Signed)
Anesthesia Post Note  Patient: Frank Gilmore  Procedure(s) Performed: LAPAROSCOPIC CHOLECYSTECTOMY     Patient location during evaluation: PACU Anesthesia Type: General Level of consciousness: awake and alert Pain management: pain level controlled Vital Signs Assessment: post-procedure vital signs reviewed and stable Respiratory status: spontaneous breathing, nonlabored ventilation and respiratory function stable Cardiovascular status: blood pressure returned to baseline and stable Postop Assessment: no apparent nausea or vomiting Anesthetic complications: no  No notable events documented.  Last Vitals:  Vitals:   10/21/22 1215 10/21/22 1226  BP: 114/68 132/82  Pulse: 74 84  Resp: 16   Temp:  36.4 C  SpO2: 92% 94%    Last Pain:  Vitals:   10/21/22 1226  TempSrc:   PainSc: 3                  ,W. EDMOND

## 2022-10-21 NOTE — Interval H&P Note (Signed)
History and Physical Interval Note: no change in H and P  10/21/2022 9:30 AM  Frank Gilmore  has presented today for surgery, with the diagnosis of CHOLECYSTITIS WITH GALLBLADDER.  The various methods of treatment have been discussed with the patient and family. After consideration of risks, benefits and other options for treatment, the patient has consented to  Procedure(s): LAPAROSCOPIC CHOLECYSTECTOMY POSS OPEN (N/A) as a surgical intervention.  The patient's history has been reviewed, patient examined, no change in status, stable for surgery.  I have reviewed the patient's chart and labs.  Questions were answered to the patient's satisfaction.     Abigail Miyamoto

## 2022-10-22 ENCOUNTER — Encounter (HOSPITAL_COMMUNITY): Payer: Self-pay | Admitting: Surgery

## 2022-11-07 ENCOUNTER — Other Ambulatory Visit: Payer: Self-pay

## 2022-11-07 ENCOUNTER — Emergency Department (HOSPITAL_COMMUNITY)
Admission: EM | Admit: 2022-11-07 | Discharge: 2022-11-07 | Disposition: A | Discharge status: Home / Self Care | Payer: Medicare Other | Source: Home / Self Care | Attending: Emergency Medicine | Admitting: Emergency Medicine

## 2022-11-07 DIAGNOSIS — Z7982 Long term (current) use of aspirin: Secondary | ICD-10-CM | POA: Insufficient documentation

## 2022-11-07 DIAGNOSIS — W260XXA Contact with knife, initial encounter: Secondary | ICD-10-CM | POA: Diagnosis not present

## 2022-11-07 DIAGNOSIS — S61012A Laceration without foreign body of left thumb without damage to nail, initial encounter: Secondary | ICD-10-CM | POA: Insufficient documentation

## 2022-11-07 DIAGNOSIS — Y93E3 Activity, vacuuming: Secondary | ICD-10-CM | POA: Insufficient documentation

## 2022-11-07 DIAGNOSIS — Z79899 Other long term (current) drug therapy: Secondary | ICD-10-CM | POA: Diagnosis not present

## 2022-11-07 DIAGNOSIS — Z23 Encounter for immunization: Secondary | ICD-10-CM | POA: Insufficient documentation

## 2022-11-07 DIAGNOSIS — S65312A Laceration of deep palmar arch of left hand, initial encounter: Secondary | ICD-10-CM

## 2022-11-07 DIAGNOSIS — S6992XA Unspecified injury of left wrist, hand and finger(s), initial encounter: Secondary | ICD-10-CM | POA: Diagnosis present

## 2022-11-07 DIAGNOSIS — S61412A Laceration without foreign body of left hand, initial encounter: Secondary | ICD-10-CM | POA: Diagnosis not present

## 2022-11-07 MED ORDER — LIDOCAINE-EPINEPHRINE (PF) 2 %-1:200000 IJ SOLN
10.0000 mL | Freq: Once | INTRAMUSCULAR | Status: AC
  Administered 2022-11-07: 10 mL via INTRADERMAL
  Filled 2022-11-07: qty 20

## 2022-11-07 MED ORDER — TETANUS-DIPHTH-ACELL PERTUSSIS 5-2.5-18.5 LF-MCG/0.5 IM SUSY
0.5000 mL | PREFILLED_SYRINGE | Freq: Once | INTRAMUSCULAR | Status: AC
  Administered 2022-11-07: 0.5 mL via INTRAMUSCULAR
  Filled 2022-11-07: qty 0.5

## 2022-11-07 NOTE — Discharge Instructions (Signed)
Please keep dressing dry for the first 24 hours.  After that, change dressing daily and monitor wound for any signs of infection.  Cleanse wound daily and apply Neosporin over the wound to help decrease risk of infection.  Please have your sutures removed in 7 days.  Return if you have any concern.

## 2022-11-07 NOTE — ED Provider Notes (Signed)
[place] EMERGENCY DEPARTMENT AT [facility] Provider Note   CSN: [id number] Arrival date & time: 11/07/22  1200     History {Add pertinent medical, surgical, social history, OB history to HPI:1} Chief Complaint  Patient presents with   thumb lac    Frank Gilmore is a 72 y.o. male.  The history is provided by the patient and medical records. No language interpreter was used.     72 year old male presenting for evaluation of thumb injury.  Patient report this morning he was in the process of cleaning his vacuum cleaner by removing hair that had stuck in the roller and in the process he accidentally stabbed his left nondominant hand with a pocket knife.  He report acute onset of sharp pain follows with bleeding and some tingling sensation to the tip of his left thumb.  He did try to rinse the wound and applied pressure prior to coming to the ER.  He is unsure of his last tetanus status.  He does take aspirin daily.  Home Medications Prior to Admission medications   Medication Sig Start Date End Date Taking? Authorizing Provider  acetaminophen (TYLENOL) 500 MG tablet Take 500 mg by mouth every 6 (six) hours as needed for moderate pain.    [provider]  aspirin EC 81 MG tablet Take 81 mg by mouth daily.      [provider]  hydrochlorothiazide (HYDRODIURIL) 12.5 MG tablet Take 6.25 tablets by mouth daily.    [provider]  losartan (COZAAR) 100 MG tablet Take 100 mg by mouth daily.    [provider]  Multiple Vitamins-Minerals (MULTIVITAMINS THER. W/MINERALS) TABS Take 1 tablet by mouth daily.      [provider]  pravastatin (PRAVACHOL) 20 MG tablet Take 20 tablets by mouth daily. 08/27/22   [provider]  Sodium Chloride Flush (NORMAL SALINE FLUSH) 0.9 % SOLN Flush drain once daily with 5 ml normal saline 09/08/22   Shon Hough, NP  traMADol (ULTRAM) 50 MG tablet Take 1 tablet (50 mg total) by mouth every 6  (six) hours as needed for moderate pain or severe pain. 10/21/22   Abigail Miyamoto, MD      Allergies    Patient has no known allergies.    Review of Systems   Review of Systems  All other systems reviewed and are negative.   Physical Exam Updated Vital Signs BP 129/83 (BP Location: Right Arm)   Pulse 77   Temp 98.1 F (36.7 C) (Oral)   Resp 19   Wt 92.5 kg   SpO2 98%   BMI 31.95 kg/m  Physical Exam Vitals and nursing note reviewed.  Constitutional:      General: He is not in acute distress.    Appearance: He is well-developed.  HENT:     Head: Atraumatic.  Eyes:     Conjunctiva/sclera: Conjunctivae normal.  Musculoskeletal:        General: Signs of injury (Left hand: There is a 2.7 cm laceration noted to the palmar aspect at the base of the left thumb with normal sensation distal to the wound and thumb with full range of motion) present.     Cervical back: Neck supple.  Skin:    Findings: No rash.  Neurological:     Mental Status: He is alert.     ED Results / Procedures / Treatments   Labs (all labs ordered are listed, but only abnormal results are displayed) Labs Reviewed -  No data to display  EKG None  Radiology No results found.  Procedures .Marland KitchenLaceration Repair  Date/Time: 11/07/2022 12:57 PM  Performed by: Fayrene Helper, PA-C Authorized by: Fayrene Helper, PA-C   Consent:    Consent obtained:  Verbal   Consent given by:  Patient   Risks, benefits, and alternatives were discussed: yes     Risks discussed:  Infection and poor wound healing   Alternatives discussed:  No treatment Universal protocol:    Procedure explained and questions answered to patient or proxy's satisfaction: yes     Relevant documents present and verified: yes     Patient identity confirmed:  Verbally with patient and arm band Anesthesia:    Anesthesia method:  Local infiltration   Local anesthetic:  Lidocaine 2% WITH epi Laceration details:    Location:  Hand   Hand  location:  L palm   Length (cm):  2.7   Depth (mm):  4 Pre-procedure details:    Preparation:  Patient was prepped and draped in usual sterile fashion Exploration:    Limited defect created (wound extended): no     Hemostasis achieved with:  Epinephrine and direct pressure   Imaging outcome: foreign body not noted     Wound exploration: wound explored through full range of motion and entire depth of wound visualized     Wound extent: muscle damage     Wound extent: no nerve damage, no tendon damage, no underlying fracture and no vascular damage     Contaminated: no   Treatment:    Area cleansed with:  Saline   Amount of cleaning:  Standard   Irrigation solution:  Sterile saline   Irrigation method:  Pressure wash   Debridement:  None   Undermining:  None   Scar revision: no   Skin repair:    Repair method:  Sutures   Suture size:  5-0   Suture material:  Prolene   Suture technique:  Simple interrupted   Number of sutures:  5 Approximation:    Approximation:  Close Repair type:    Repair type:  Intermediate Post-procedure details:    Dressing:  Non-adherent dressing   Procedure completion:  Tolerated well, no immediate complications   {Document cardiac monitor, telemetry assessment procedure when appropriate:1}  Medications Ordered in ED Medications  Tdap (BOOSTRIX) injection 0.5 mL (0.5 mLs Intramuscular Given 11/07/22 1227)  lidocaine-EPINEPHrine (XYLOCAINE W/EPI) 2 %-1:200000 (PF) injection 10 mL (10 mLs Intradermal Given 11/07/22 1228)    ED Course/ Medical Decision Making/ A&P   {   Click here for ABCD2, HEART and other calculatorsREFRESH Note before signing :1}                              Medical Decision Making Risk Prescription drug management.   BP 129/83 (BP Location: Right Arm)   Pulse 77   Temp 98.1 F (36.7 C) (Oral)   Resp 19   Wt 92.5 kg   SpO2 98%   BMI 31.95 kg/m   79:60 PM 72 year old male presenting for evaluation of thumb injury.   Patient report this morning he was in the process of cleaning his vacuum cleaner by removing hair that had stuck in the roller and in the process he accidentally stabbed his left nondominant hand with a pocket knife.  He report acute onset of sharp pain follows with bleeding and some tingling sensation to the tip of his left thumb.  He  did try to rinse the wound and applied pressure prior to coming to the ER.  He is unsure of his last tetanus status.  He does take aspirin daily.  On exam this is a well-appearing male resting comfortably in bed appears to be in no acute discomfort.  His left hand is wrapped in a cloth towel.  Towel removed and patient has a 2.7 cm laceration noted to the base of his thumb on the volar aspect with intact sensation distally and full range of motion about the thumb.  X-ray of left hand considered but not performed as I have low suspicion for bony injury.  Patient did report some tingling sensation to the tip of his finger likely neuropraxia from possible nerve injury.  Laceration was repaired by me I did not noted any obvious tendon or nerve or arterial injury.  Wound was deep enough to lacerate a portion of the underlying musculature but no bony involvement.  1:00 PM Laceration was repaired by me using nonabsorbable sutures.  There was some muscle involvement but no obvious nerve or tendon injury or bony injury.  Patient was giving appropriate wound care instruction and recommendation to have sutures removed in 7 days.  Social determinant of health including tobacco use.  {Document critical care time when appropriate:1} {Document review of labs and clinical decision tools ie heart score, Chads2Vasc2 etc:1}  {Document your independent review of radiology images, and any outside records:1} {Document your discussion with family members, caretakers, and with consultants:1} {Document social determinants of health affecting pt's care:1} {Document your decision making why or why  not admission, treatments were needed:1} Final Clinical Impression(s) / ED Diagnoses Final diagnoses:  Laceration of deep palmar arch of left hand, initial encounter    Rx / DC Orders ED Discharge Orders     None

## 2022-11-07 NOTE — ED Triage Notes (Signed)
POV/ cut left thumb with pocket knife/ unintentional/ takes ASA daily/ ambulatory/ A&Ox4

## 2022-11-17 DIAGNOSIS — R200 Anesthesia of skin: Secondary | ICD-10-CM | POA: Diagnosis not present

## 2022-11-17 DIAGNOSIS — S65312D Laceration of deep palmar arch of left hand, subsequent encounter: Secondary | ICD-10-CM | POA: Diagnosis not present

## 2022-11-17 DIAGNOSIS — Z9049 Acquired absence of other specified parts of digestive tract: Secondary | ICD-10-CM | POA: Diagnosis not present

## 2022-11-17 DIAGNOSIS — K819 Cholecystitis, unspecified: Secondary | ICD-10-CM | POA: Diagnosis not present

## 2022-11-17 DIAGNOSIS — Z2821 Immunization not carried out because of patient refusal: Secondary | ICD-10-CM | POA: Diagnosis not present

## 2023-01-04 NOTE — Progress Notes (Signed)
   PROVIDER:  VICENTA DASIE POLI, MD  MRN: [id number] DOB: [birth year] DATE OF ENCOUNTER: 01/04/2023 Interval History:     He is here for his postoperative visit status post laparoscopic cholecystectomy performed on August 7.  He reports he is doing well and has had minimal loose bowel movements and minimal nausea postoperatively.    Physical Examination:   Physical Exam   He appears well on exam.  His abdomen is soft and nontender and his incisions are healing well  The final pathology showed both acute and chronic cholecystitis with gallstones.   Assessment and Plan:    Diagnoses and all orders for this visit:  Postop check     He is now doing well status post laparoscopic cholecystectomy for cholecystitis.  He has already returned to work and normal activity.  I will see him back as needed    Return if symptoms worsen or fail to improve.   The plan was discussed in detail with the patient today, who expressed understanding.  The patient has my contact information, and understands to call me with any additional questions or concerns in the interval.  I would be happy to see the patient back sooner if the need arises.   VICENTA DASIE POLI, MD

## 2023-02-23 DIAGNOSIS — H1033 Unspecified acute conjunctivitis, bilateral: Secondary | ICD-10-CM | POA: Diagnosis not present

## 2023-03-24 DIAGNOSIS — R7303 Prediabetes: Secondary | ICD-10-CM | POA: Diagnosis not present

## 2023-03-24 DIAGNOSIS — Z125 Encounter for screening for malignant neoplasm of prostate: Secondary | ICD-10-CM | POA: Diagnosis not present

## 2023-03-24 DIAGNOSIS — E782 Mixed hyperlipidemia: Secondary | ICD-10-CM | POA: Diagnosis not present

## 2023-03-30 DIAGNOSIS — M25562 Pain in left knee: Secondary | ICD-10-CM | POA: Diagnosis not present

## 2023-03-30 DIAGNOSIS — R7303 Prediabetes: Secondary | ICD-10-CM | POA: Diagnosis not present

## 2023-03-30 DIAGNOSIS — E782 Mixed hyperlipidemia: Secondary | ICD-10-CM | POA: Diagnosis not present

## 2023-03-30 DIAGNOSIS — Z87442 Personal history of urinary calculi: Secondary | ICD-10-CM | POA: Diagnosis not present

## 2023-03-30 DIAGNOSIS — I10 Essential (primary) hypertension: Secondary | ICD-10-CM | POA: Diagnosis not present

## 2023-03-30 DIAGNOSIS — I129 Hypertensive chronic kidney disease with stage 1 through stage 4 chronic kidney disease, or unspecified chronic kidney disease: Secondary | ICD-10-CM | POA: Diagnosis not present

## 2023-03-30 DIAGNOSIS — L570 Actinic keratosis: Secondary | ICD-10-CM | POA: Diagnosis not present

## 2023-03-30 DIAGNOSIS — N182 Chronic kidney disease, stage 2 (mild): Secondary | ICD-10-CM | POA: Diagnosis not present

## 2023-04-09 ENCOUNTER — Encounter (INDEPENDENT_AMBULATORY_CARE_PROVIDER_SITE_OTHER): Payer: Self-pay | Admitting: *Deleted

## 2023-07-06 DIAGNOSIS — R7303 Prediabetes: Secondary | ICD-10-CM | POA: Diagnosis not present

## 2023-07-06 DIAGNOSIS — E782 Mixed hyperlipidemia: Secondary | ICD-10-CM | POA: Diagnosis not present

## 2023-07-29 DIAGNOSIS — R7303 Prediabetes: Secondary | ICD-10-CM | POA: Diagnosis not present

## 2023-07-29 DIAGNOSIS — I10 Essential (primary) hypertension: Secondary | ICD-10-CM | POA: Diagnosis not present

## 2023-07-29 DIAGNOSIS — I129 Hypertensive chronic kidney disease with stage 1 through stage 4 chronic kidney disease, or unspecified chronic kidney disease: Secondary | ICD-10-CM | POA: Diagnosis not present

## 2023-07-29 DIAGNOSIS — Z87442 Personal history of urinary calculi: Secondary | ICD-10-CM | POA: Diagnosis not present

## 2023-07-29 DIAGNOSIS — N182 Chronic kidney disease, stage 2 (mild): Secondary | ICD-10-CM | POA: Diagnosis not present

## 2023-07-29 DIAGNOSIS — E782 Mixed hyperlipidemia: Secondary | ICD-10-CM | POA: Diagnosis not present

## 2023-07-29 DIAGNOSIS — K760 Fatty (change of) liver, not elsewhere classified: Secondary | ICD-10-CM | POA: Diagnosis not present

## 2023-07-29 DIAGNOSIS — Z79899 Other long term (current) drug therapy: Secondary | ICD-10-CM | POA: Diagnosis not present

## 2023-07-29 DIAGNOSIS — Z713 Dietary counseling and surveillance: Secondary | ICD-10-CM | POA: Diagnosis not present

## 2023-07-29 DIAGNOSIS — M25562 Pain in left knee: Secondary | ICD-10-CM | POA: Diagnosis not present

## 2023-07-29 DIAGNOSIS — Z87891 Personal history of nicotine dependence: Secondary | ICD-10-CM | POA: Diagnosis not present

## 2023-07-29 DIAGNOSIS — L570 Actinic keratosis: Secondary | ICD-10-CM | POA: Diagnosis not present

## 2023-10-07 ENCOUNTER — Encounter (INDEPENDENT_AMBULATORY_CARE_PROVIDER_SITE_OTHER): Payer: Self-pay | Admitting: *Deleted

## 2023-11-02 DIAGNOSIS — R7303 Prediabetes: Secondary | ICD-10-CM | POA: Diagnosis not present

## 2023-11-02 DIAGNOSIS — E782 Mixed hyperlipidemia: Secondary | ICD-10-CM | POA: Diagnosis not present

## 2023-11-12 DIAGNOSIS — Z1211 Encounter for screening for malignant neoplasm of colon: Secondary | ICD-10-CM | POA: Diagnosis not present

## 2023-11-12 DIAGNOSIS — K760 Fatty (change of) liver, not elsewhere classified: Secondary | ICD-10-CM | POA: Diagnosis not present

## 2023-11-12 DIAGNOSIS — Z125 Encounter for screening for malignant neoplasm of prostate: Secondary | ICD-10-CM | POA: Diagnosis not present

## 2023-11-12 DIAGNOSIS — L570 Actinic keratosis: Secondary | ICD-10-CM | POA: Diagnosis not present

## 2023-11-12 DIAGNOSIS — Z87442 Personal history of urinary calculi: Secondary | ICD-10-CM | POA: Diagnosis not present

## 2023-11-12 DIAGNOSIS — N182 Chronic kidney disease, stage 2 (mild): Secondary | ICD-10-CM | POA: Diagnosis not present

## 2023-11-12 DIAGNOSIS — R7303 Prediabetes: Secondary | ICD-10-CM | POA: Diagnosis not present

## 2023-11-12 DIAGNOSIS — M25562 Pain in left knee: Secondary | ICD-10-CM | POA: Diagnosis not present

## 2023-11-12 DIAGNOSIS — E782 Mixed hyperlipidemia: Secondary | ICD-10-CM | POA: Diagnosis not present

## 2023-11-12 DIAGNOSIS — I129 Hypertensive chronic kidney disease with stage 1 through stage 4 chronic kidney disease, or unspecified chronic kidney disease: Secondary | ICD-10-CM | POA: Diagnosis not present

## 2023-11-12 DIAGNOSIS — I10 Essential (primary) hypertension: Secondary | ICD-10-CM | POA: Diagnosis not present

## 2023-11-12 DIAGNOSIS — W19XXXD Unspecified fall, subsequent encounter: Secondary | ICD-10-CM | POA: Diagnosis not present

## 2024-01-11 ENCOUNTER — Other Ambulatory Visit: Payer: Self-pay

## 2024-01-11 DIAGNOSIS — N200 Calculus of kidney: Secondary | ICD-10-CM

## 2024-01-12 ENCOUNTER — Encounter: Payer: Self-pay | Admitting: Urology

## 2024-01-12 ENCOUNTER — Ambulatory Visit (HOSPITAL_COMMUNITY)
Admission: RE | Admit: 2024-01-12 | Discharge: 2024-01-12 | Disposition: A | Discharge status: Home / Self Care | Source: Ambulatory Visit | Attending: Urology | Admitting: Urology

## 2024-01-12 ENCOUNTER — Ambulatory Visit: Admitting: Urology

## 2024-01-12 VITALS — BP 145/82 | HR 62

## 2024-01-12 DIAGNOSIS — N200 Calculus of kidney: Secondary | ICD-10-CM

## 2024-01-12 LAB — URINALYSIS, ROUTINE W REFLEX MICROSCOPIC
Bilirubin, UA: NEGATIVE
Glucose, UA: NEGATIVE
Ketones, UA: NEGATIVE
Nitrite, UA: NEGATIVE
Protein,UA: NEGATIVE
Specific Gravity, UA: <1.005 — ABNORMAL LOW (ref 1.005–1.030)
Urobilinogen, Ur: 0.2 mg/dL (ref 0.2–1.0)
pH, UA: 6 (ref 5.0–7.5)

## 2024-01-12 LAB — MICROSCOPIC EXAMINATION: RBC, Urine: >30 /HPF — AB (ref 0–2)

## 2024-01-12 MED ORDER — TAMSULOSIN HCL 0.4 MG PO CAPS: 0.4000 mg | ORAL_CAPSULE | Freq: Every day | ORAL | 11 refills | Status: AC

## 2024-01-12 NOTE — Patient Instructions (Signed)

## 2024-01-12 NOTE — Progress Notes (Signed)
 01/12/2024 9:12 AM   Alm LELON [place] [birth year] [id number]  Referring provider: Joeann Browning, FNP [address] [place],  [place] [address]  nephrolithiasis   HPI: Mr Kurt is a 73yo here for evaluation of nephrolithiasis. He underwent CT 08/2022 which showed numerous bilateral renal calculi up to 11mm. KUB from today shows a 16mm right lower pole calculus. 1 week ago he developed right flank pain that radiated to his right testicle. UA today shows >30 RBCs/hpf.  He started getting stones in his 30s. He stopped all soda and tea drinking 5 months ago and lost 30lbs. He denies any nausea or vomiting. He denies any worsening LUTS.    PMH: Past Medical History:  Diagnosis Date   Hyperlipidemia    Hypertension    Kidney stone    Pneumonia     Surgical History: Past Surgical History:  Procedure Laterality Date   CHOLECYSTECTOMY N/A 10/21/2022   Procedure: LAPAROSCOPIC CHOLECYSTECTOMY;  Surgeon: Vernetta Berg, MD;  Location: [facility] ORS;  Service: General;  Laterality: N/A;   IR PERC CHOLECYSTOSTOMY  09/06/2022   LITHOTRIPSY     WISDOM TOOTH EXTRACTION      Home Medications:  Allergies as of 01/12/2024   No Known Allergies      Medication List        Accurate as of January 12, 2024  9:12 AM. If you have any questions, ask your nurse or doctor.          acetaminophen  500 MG tablet Commonly known as: TYLENOL  Take 500 mg by mouth every 6 (six) hours as needed for moderate pain.   aspirin EC 81 MG tablet Take 81 mg by mouth daily.   hydrochlorothiazide 12.5 MG tablet Commonly known as: HYDRODIURIL Take 6.25 tablets by mouth daily.   losartan 100 MG tablet Commonly known as: COZAAR Take 100 mg by mouth daily.   multivitamins ther. w/minerals Tabs tablet Take 1 tablet by mouth daily.   Normal Saline Flush 0.9 % Soln Flush drain once daily with 5 ml normal saline   pravastatin 20 MG tablet Commonly known as: PRAVACHOL Take 20 tablets by mouth daily.    tamsulosin  0.4 MG Caps capsule Commonly known as: FLOMAX  TAKE 1 CAPSULE EVERY DAY BY ORAL ROUTE AS DIRECTED FOR KIDNEY STONES   traMADol  50 MG tablet Commonly known as: Ultram  Take 1 tablet (50 mg total) by mouth every 6 (six) hours as needed for moderate pain or severe pain.        Allergies: No Known Allergies  Family History: No family history on file.  Social History:  reports that he has quit smoking. His smoking use included cigarettes. He has never used smokeless tobacco. He reports that he does not drink alcohol and does not use drugs.  ROS: All other review of systems were reviewed and are negative except what is noted above in HPI  Physical Exam: BP (!) 145/82   Pulse 62   Constitutional:  Alert and oriented, No acute distress. HEENT: [place] AT, moist mucus membranes.  Trachea midline, no masses. Cardiovascular: No clubbing, cyanosis, or edema. Respiratory: Normal respiratory effort, no increased work of breathing. GI: Abdomen is soft, nontender, nondistended, no abdominal masses GU: No CVA tenderness.  Lymph: No cervical or inguinal lymphadenopathy. Skin: No rashes, bruises or suspicious lesions. Neurologic: Grossly intact, no focal deficits, moving all 4 extremities. Psychiatric: Normal mood and affect.  Laboratory Data: Lab Results  Component Value Date   WBC 7.7 10/12/2022   HGB  14.7 10/12/2022   HCT 46.3 10/12/2022   MCV 95.7 10/12/2022   PLT 177 10/12/2022    Lab Results  Component Value Date   CREATININE 1.11 10/12/2022    No results found for: PSA  No results found for: TESTOSTERONE  No results found for: HGBA1C  Urinalysis    Component Value Date/Time   COLORURINE YELLOW 09/05/2022 0601   APPEARANCEUR CLEAR 09/05/2022 0601   LABSPEC 1.020 09/05/2022 0601   PHURINE 5.0 09/05/2022 0601   GLUCOSEU NEGATIVE 09/05/2022 0601   HGBUR SMALL (A) 09/05/2022 0601   BILIRUBINUR NEGATIVE 09/05/2022 0601   KETONESUR 5 (A) 09/05/2022 0601    PROTEINUR 30 (A) 09/05/2022 0601   UROBILINOGEN 0.2 02/14/2011 1110   NITRITE NEGATIVE 09/05/2022 0601   LEUKOCYTESUR NEGATIVE 09/05/2022 0601    Lab Results  Component Value Date   BACTERIA NONE SEEN 09/05/2022    Pertinent Imaging: CT 09/07/22 and KUB today: Images reviewed and discussed with the patient  Results for orders placed in visit on 06/12/99  DG Abd 1 View  Narrative FINDINGS CLINICAL DATA:  LEFT LOWER QUADRANT ABDOMINAL PAIN AND RENAL CALCULUS. ABDOMEN: THE PATIENT'S PRIOR CT SCAN FROM 06/07/99 IS NOT IN HIS JACKET AND IS AVAILABLE FOR COMPARISON PURPOSES. NEAR THE TIP OF THE L4 TRANSVERSE PROCESS ON THE LEFT, THERE IS A 5-6 MM CALCIFIC DENSITY LIKELY REPRESENTING A RENAL CALCULUS. NO ADDITIONAL FINDINGS SUSPICIOUS FOR CALCULI ARE IDENTIFIED. IMPRESSION 5-6 MM DENSITY PROJECTING NEAR THE TIP OF THE LEFT L4 PROCESS SUSPICIOUS FOR CALCULUS.  MY NUMERATION STRATEGY FOR THE LUMBAR SPINE ASSUMES THAT THE L5 VERTEBRA IS PARTIALLY SACRALIZED.  No results found for this or any previous visit.  No results found for this or any previous visit.  No results found for this or any previous visit.  No results found for this or any previous visit.  No results found for this or any previous visit.  No results found for this or any previous visit.  Results for orders placed during the hospital encounter of 09/05/22  CT RENAL STONE STUDY  Narrative CLINICAL DATA:  Flank pain, evaluate for renal calculi  EXAM: CT ABDOMEN AND PELVIS WITHOUT CONTRAST  TECHNIQUE: Multidetector CT imaging of the abdomen and pelvis was performed following the standard protocol without IV contrast.  RADIATION DOSE REDUCTION: This exam was performed according to the departmental dose-optimization program which includes automated exposure control, adjustment of the mA and/or kV according to patient size and/or use of iterative reconstruction technique.  COMPARISON:   09/05/2022  FINDINGS: Lower chest: Right basilar atelectasis is noted with small effusion. Mild left basilar atelectasis is seen as well.  Hepatobiliary: Cholecystostomy tube is noted in place. Liver appears within normal limits. Mild inflammatory changes are noted surrounding the gallbladder.  Pancreas: Unremarkable. No pancreatic ductal dilatation or surrounding inflammatory changes.  Spleen: Normal in size without focal abnormality.  Adrenals/Urinary Tract: Adrenal glands are within normal limits. Kidneys demonstrate mild hydronephrosis on the left. This is secondary to a 5 mm stone which now lies just above the ureterovesical junction somewhat further migrated when compared with the prior exam. No obstructive changes are noted on the right. Bilateral renal calculi are seen right greater than left. The largest of these is noted in the right renal pelvis again measuring up to 12 mm. The bladder is decompressed.  Stomach/Bowel: Scattered diverticular change of the colon is noted. No evidence of diverticulitis is seen. The appendix is within normal limits. Small bowel and stomach are unremarkable.  Vascular/Lymphatic:  Aortic atherosclerosis. No enlarged abdominal or pelvic lymph nodes.  Reproductive: Prostate is unremarkable.  Other: No abdominal wall hernia or abnormality. No abdominopelvic ascites.  Musculoskeletal: No acute or significant osseous findings.  IMPRESSION: Distal left ureteral stone measuring 5 mm causing hydronephrosis. There has been some distal migration when compared with the prior exam.  Bilateral nonobstructing renal calculi stable from the prior study.  Changes consistent with prior cholecystostomy tube placement.   Electronically Signed By: Oneil Devonshire M.D. On: 09/07/2022 11:54   Assessment & Plan:    1. Kidney stones (Primary) -We discussed the management of kidney stones. These options include observation, ureteroscopy, shockwave  lithotripsy (ESWL) and percutaneous nephrolithotomy (PCNL). We discussed which options are relevant to the patient's stone(s). We discussed the natural history of kidney stones as well as the complications of untreated stones and the impact on quality of life without treatment as well as with each of the above listed treatments. We also discussed the efficacy of each treatment in its ability to clear the stone burden. With any of these management options I discussed the signs and symptoms of infection and the need for emergent treatment should these be experienced. For each option we discussed the ability of each procedure to clear the patient of their stone burden.   For observation I described the risks which include but are not limited to silent renal damage, life-threatening infection, need for emergent surgery, failure to pass stone and pain.   For ureteroscopy I described the risks which include bleeding, infection, damage to contiguous structures, positioning injury, ureteral stricture, ureteral avulsion, ureteral injury, need for prolonged ureteral stent, inability to perform ureteroscopy, need for an interval procedure, inability to clear stone burden, stent discomfort/pain, heart attack, stroke, pulmonary embolus and the inherent risks with general anesthesia.   For shockwave lithotripsy I described the risks which include arrhythmia, kidney contusion, kidney hemorrhage, need for transfusion, pain, inability to adequately break up stone, inability to pass stone fragments, Steinstrasse, infection associated with obstructing stones, need for alternate surgical procedure, need for repeat shockwave lithotripsy, MI, CVA, PE and the inherent risks with anesthesia/conscious sedation.   For PCNL I described the risks including positioning injury, pneumothorax, hydrothorax, need for chest tube, inability to clear stone burden, renal laceration, arterial venous fistula or malformation, need for embolization  of kidney, loss of kidney or renal function, need for repeat procedure, need for prolonged nephrostomy tube, ureteral avulsion, MI, CVA, PE and the inherent risks of general anesthesia.   - The patient would like to proceed with Right ESWL - Urinalysis, Routine w reflex microscopic   No follow-ups on file.  Belvie Clara, MD  [facility] [place]

## 2024-01-12 NOTE — H&P (View-Only) (Signed)
 01/12/2024 9:12 AM   Alm LELON [place] [id number] [id number]  Referring provider: Joeann Browning, FNP [address] [place],  [place] [address]  nephrolithiasis   HPI: Mr Kurt is a 72yo here for evaluation of nephrolithiasis. He underwent CT 08/2022 which showed numerous bilateral renal calculi up to 11mm. KUB from today shows a 16mm right lower pole calculus. 1 week ago he developed right flank pain that radiated to his right testicle. UA today shows >30 RBCs/hpf.  He started getting stones in his 30s. He stopped all soda and tea drinking 5 months ago and lost 30lbs. He denies any nausea or vomiting. He denies any worsening LUTS.    PMH: Past Medical History:  Diagnosis Date   Hyperlipidemia    Hypertension    Kidney stone    Pneumonia     Surgical History: Past Surgical History:  Procedure Laterality Date   CHOLECYSTECTOMY N/A 10/21/2022   Procedure: LAPAROSCOPIC CHOLECYSTECTOMY;  Surgeon: Vernetta Berg, MD;  Location: [facility];  Service: General;  Laterality: N/A;   IR PERC CHOLECYSTOSTOMY  09/06/2022   LITHOTRIPSY     WISDOM TOOTH EXTRACTION      Home Medications:  Allergies as of 01/12/2024   No Known Allergies      Medication List        Accurate as of January 12, 2024  9:12 AM. If you have any questions, ask your nurse or doctor.          acetaminophen  500 MG tablet Commonly known as: TYLENOL  Take 500 mg by mouth every 6 (six) hours as needed for moderate pain.   aspirin EC 81 MG tablet Take 81 mg by mouth daily.   hydrochlorothiazide 12.5 MG tablet Commonly known as: HYDRODIURIL Take 6.25 tablets by mouth daily.   losartan 100 MG tablet Commonly known as: COZAAR Take 100 mg by mouth daily.   multivitamins ther. w/minerals Tabs tablet Take 1 tablet by mouth daily.   Normal Saline Flush 0.9 % Soln Flush drain once daily with 5 ml normal saline   pravastatin 20 MG tablet Commonly known as: PRAVACHOL Take 20 tablets by mouth daily.    tamsulosin  0.4 MG Caps capsule Commonly known as: FLOMAX  TAKE 1 CAPSULE EVERY DAY BY ORAL ROUTE AS DIRECTED FOR KIDNEY STONES   traMADol  50 MG tablet Commonly known as: Ultram  Take 1 tablet (50 mg total) by mouth every 6 (six) hours as needed for moderate pain or severe pain.        Allergies: No Known Allergies  Family History: No family history on file.  Social History:  reports that he has quit smoking. His smoking use included cigarettes. He has never used smokeless tobacco. He reports that he does not drink alcohol and does not use drugs.  ROS: All other review of systems were reviewed and are negative except what is noted above in HPI  Physical Exam: BP (!) 145/82   Pulse 62   Constitutional:  Alert and oriented, No acute distress. HEENT: [place] AT, moist mucus membranes.  Trachea midline, no masses. Cardiovascular: No clubbing, cyanosis, or edema. Respiratory: Normal respiratory effort, no increased work of breathing. GI: Abdomen is soft, nontender, nondistended, no abdominal masses GU: No CVA tenderness.  Lymph: No cervical or inguinal lymphadenopathy. Skin: No rashes, bruises or suspicious lesions. Neurologic: Grossly intact, no focal deficits, moving all 4 extremities. Psychiatric: Normal mood and affect.  Laboratory Data: Lab Results  Component Value Date   WBC 7.7 10/12/2022   HGB  14.7 10/12/2022   HCT 46.3 10/12/2022   MCV 95.7 10/12/2022   PLT 177 10/12/2022    Lab Results  Component Value Date   CREATININE 1.11 10/12/2022    No results found for: PSA  No results found for: TESTOSTERONE  No results found for: HGBA1C  Urinalysis    Component Value Date/Time   COLORURINE YELLOW 09/05/2022 0601   APPEARANCEUR CLEAR 09/05/2022 0601   LABSPEC 1.020 09/05/2022 0601   PHURINE 5.0 09/05/2022 0601   GLUCOSEU NEGATIVE 09/05/2022 0601   HGBUR SMALL (A) 09/05/2022 0601   BILIRUBINUR NEGATIVE 09/05/2022 0601   KETONESUR 5 (A) 09/05/2022 0601    PROTEINUR 30 (A) 09/05/2022 0601   UROBILINOGEN 0.2 02/14/2011 1110   NITRITE NEGATIVE 09/05/2022 0601   LEUKOCYTESUR NEGATIVE 09/05/2022 0601    Lab Results  Component Value Date   BACTERIA NONE SEEN 09/05/2022    Pertinent Imaging: CT 09/07/22 and KUB today: Images reviewed and discussed with the patient  Results for orders placed in visit on 06/12/99  DG Abd 1 View  Narrative FINDINGS CLINICAL DATA:  LEFT LOWER QUADRANT ABDOMINAL PAIN AND RENAL CALCULUS. ABDOMEN: THE PATIENT'S PRIOR CT SCAN FROM 06/07/99 IS NOT IN HIS JACKET AND IS AVAILABLE FOR COMPARISON PURPOSES. NEAR THE TIP OF THE L4 TRANSVERSE PROCESS ON THE LEFT, THERE IS A 5-6 MM CALCIFIC DENSITY LIKELY REPRESENTING A RENAL CALCULUS. NO ADDITIONAL FINDINGS SUSPICIOUS FOR CALCULI ARE IDENTIFIED. IMPRESSION 5-6 MM DENSITY PROJECTING NEAR THE TIP OF THE LEFT L4 PROCESS SUSPICIOUS FOR CALCULUS.  MY NUMERATION STRATEGY FOR THE LUMBAR SPINE ASSUMES THAT THE L5 VERTEBRA IS PARTIALLY SACRALIZED.  No results found for this or any previous visit.  No results found for this or any previous visit.  No results found for this or any previous visit.  No results found for this or any previous visit.  No results found for this or any previous visit.  No results found for this or any previous visit.  Results for orders placed during the hospital encounter of 09/05/22  CT RENAL STONE STUDY  Narrative CLINICAL DATA:  Flank pain, evaluate for renal calculi  EXAM: CT ABDOMEN AND PELVIS WITHOUT CONTRAST  TECHNIQUE: Multidetector CT imaging of the abdomen and pelvis was performed following the standard protocol without IV contrast.  RADIATION DOSE REDUCTION: This exam was performed according to the departmental dose-optimization program which includes automated exposure control, adjustment of the mA and/or kV according to patient size and/or use of iterative reconstruction technique.  COMPARISON:   09/05/2022  FINDINGS: Lower chest: Right basilar atelectasis is noted with small effusion. Mild left basilar atelectasis is seen as well.  Hepatobiliary: Cholecystostomy tube is noted in place. Liver appears within normal limits. Mild inflammatory changes are noted surrounding the gallbladder.  Pancreas: Unremarkable. No pancreatic ductal dilatation or surrounding inflammatory changes.  Spleen: Normal in size without focal abnormality.  Adrenals/Urinary Tract: Adrenal glands are within normal limits. Kidneys demonstrate mild hydronephrosis on the left. This is secondary to a 5 mm stone which now lies just above the ureterovesical junction somewhat further migrated when compared with the prior exam. No obstructive changes are noted on the right. Bilateral renal calculi are seen right greater than left. The largest of these is noted in the right renal pelvis again measuring up to 12 mm. The bladder is decompressed.  Stomach/Bowel: Scattered diverticular change of the colon is noted. No evidence of diverticulitis is seen. The appendix is within normal limits. Small bowel and stomach are unremarkable.  Vascular/Lymphatic:  Aortic atherosclerosis. No enlarged abdominal or pelvic lymph nodes.  Reproductive: Prostate is unremarkable.  Other: No abdominal wall hernia or abnormality. No abdominopelvic ascites.  Musculoskeletal: No acute or significant osseous findings.  IMPRESSION: Distal left ureteral stone measuring 5 mm causing hydronephrosis. There has been some distal migration when compared with the prior exam.  Bilateral nonobstructing renal calculi stable from the prior study.  Changes consistent with prior cholecystostomy tube placement.   Electronically Signed By: Oneil Devonshire M.D. On: 09/07/2022 11:54   Assessment & Plan:    1. Kidney stones (Primary) -We discussed the management of kidney stones. These options include observation, ureteroscopy, shockwave  lithotripsy (ESWL) and percutaneous nephrolithotomy (PCNL). We discussed which options are relevant to the patient's stone(s). We discussed the natural history of kidney stones as well as the complications of untreated stones and the impact on quality of life without treatment as well as with each of the above listed treatments. We also discussed the efficacy of each treatment in its ability to clear the stone burden. With any of these management options I discussed the signs and symptoms of infection and the need for emergent treatment should these be experienced. For each option we discussed the ability of each procedure to clear the patient of their stone burden.   For observation I described the risks which include but are not limited to silent renal damage, life-threatening infection, need for emergent surgery, failure to pass stone and pain.   For ureteroscopy I described the risks which include bleeding, infection, damage to contiguous structures, positioning injury, ureteral stricture, ureteral avulsion, ureteral injury, need for prolonged ureteral stent, inability to perform ureteroscopy, need for an interval procedure, inability to clear stone burden, stent discomfort/pain, heart attack, stroke, pulmonary embolus and the inherent risks with general anesthesia.   For shockwave lithotripsy I described the risks which include arrhythmia, kidney contusion, kidney hemorrhage, need for transfusion, pain, inability to adequately break up stone, inability to pass stone fragments, Steinstrasse, infection associated with obstructing stones, need for alternate surgical procedure, need for repeat shockwave lithotripsy, MI, CVA, PE and the inherent risks with anesthesia/conscious sedation.   For PCNL I described the risks including positioning injury, pneumothorax, hydrothorax, need for chest tube, inability to clear stone burden, renal laceration, arterial venous fistula or malformation, need for embolization  of kidney, loss of kidney or renal function, need for repeat procedure, need for prolonged nephrostomy tube, ureteral avulsion, MI, CVA, PE and the inherent risks of general anesthesia.   - The patient would like to proceed with Right ESWL - Urinalysis, Routine w reflex microscopic   No follow-ups on file.  Belvie Clara, MD  [facility] [place]

## 2024-01-14 ENCOUNTER — Telehealth: Payer: Self-pay

## 2024-01-14 DIAGNOSIS — N200 Calculus of kidney: Secondary | ICD-10-CM

## 2024-01-14 LAB — BASIC METABOLIC PANEL WITH GFR
BUN/Creatinine Ratio: 16 (ref 10–24)
BUN: 39 mg/dL — ABNORMAL HIGH (ref 8–27)
CO2: 20 mmol/L (ref 20–29)
Calcium: 9.8 mg/dL (ref 8.6–10.2)
Chloride: 100 mmol/L (ref 96–106)
Creatinine, Ser: 2.48 mg/dL — ABNORMAL HIGH (ref 0.76–1.27)
Glucose: 86 mg/dL (ref 70–99)
Potassium: 4.7 mmol/L (ref 3.5–5.2)
Sodium: 139 mmol/L (ref 134–144)
eGFR: 27 mL/min/1.73 — ABNORMAL LOW (ref >59)

## 2024-01-14 LAB — PTH, INTACT AND CALCIUM: PTH: 31 pg/mL (ref 15–65)

## 2024-01-14 LAB — URIC ACID: Uric Acid: 7.1 mg/dL (ref 3.8–8.4)

## 2024-01-14 NOTE — Telephone Encounter (Signed)
 Return call to pt. Pt is concern about his labs and his UA. Pt was made aware that Dr. Sherrilee review his urinalysis at his office visited and his labs will be routed to MD to review. Voiced understanding

## 2024-01-18 ENCOUNTER — Other Ambulatory Visit: Payer: Self-pay

## 2024-01-18 DIAGNOSIS — N200 Calculus of kidney: Secondary | ICD-10-CM

## 2024-01-18 NOTE — Telephone Encounter (Signed)
 Pt return call. Pt was made aware MD McKenzie want him to get a CT stone study. Pt state's he is scheduled for litho on 11/11. Pt wants to know if this CT should be prior to his litho or be done after his litho. Pt is made aware CT has to be authorized through insurance first and then someone will call him to scheduled. Pt is aware a message will be sen to MD on advisement

## 2024-01-18 NOTE — Telephone Encounter (Addendum)
 Tried calling pt with no answer, Left detailed Vm making patient aware of CT stone study order.He needs a CT stone study

## 2024-01-20 ENCOUNTER — Encounter (HOSPITAL_COMMUNITY): Payer: Self-pay

## 2024-01-21 ENCOUNTER — Encounter (HOSPITAL_COMMUNITY)
Admission: RE | Admit: 2024-01-21 | Discharge: 2024-01-21 | Disposition: A | Discharge status: Home / Self Care | Source: Ambulatory Visit | Attending: Urology | Admitting: Urology

## 2024-01-25 ENCOUNTER — Encounter (HOSPITAL_COMMUNITY)
Admission: RE | Disposition: A | Discharge status: Home / Self Care | Payer: Self-pay | Source: Home / Self Care | Attending: Urology

## 2024-01-25 ENCOUNTER — Ambulatory Visit (HOSPITAL_COMMUNITY)

## 2024-01-25 ENCOUNTER — Ambulatory Visit (HOSPITAL_COMMUNITY)
Admission: RE | Admit: 2024-01-25 | Discharge: 2024-01-25 | Disposition: A | Discharge status: Home / Self Care | Attending: Urology | Admitting: Urology

## 2024-01-25 ENCOUNTER — Encounter (HOSPITAL_COMMUNITY): Payer: Self-pay | Admitting: Urology

## 2024-01-25 DIAGNOSIS — I878 Other specified disorders of veins: Secondary | ICD-10-CM | POA: Diagnosis not present

## 2024-01-25 DIAGNOSIS — K810 Acute cholecystitis: Secondary | ICD-10-CM

## 2024-01-25 DIAGNOSIS — I10 Essential (primary) hypertension: Secondary | ICD-10-CM | POA: Diagnosis not present

## 2024-01-25 DIAGNOSIS — N200 Calculus of kidney: Secondary | ICD-10-CM

## 2024-01-25 DIAGNOSIS — N132 Hydronephrosis with renal and ureteral calculous obstruction: Secondary | ICD-10-CM | POA: Diagnosis not present

## 2024-01-25 DIAGNOSIS — Z87442 Personal history of urinary calculi: Secondary | ICD-10-CM | POA: Insufficient documentation

## 2024-01-25 DIAGNOSIS — Z87891 Personal history of nicotine dependence: Secondary | ICD-10-CM | POA: Insufficient documentation

## 2024-01-25 HISTORY — PX: EXTRACORPOREAL SHOCK WAVE LITHOTRIPSY: SHX1557

## 2024-01-25 SURGERY — LITHOTRIPSY, ESWL
Anesthesia: LOCAL | Laterality: Right

## 2024-01-25 MED ORDER — DIPHENHYDRAMINE HCL 25 MG PO CAPS
25.0000 mg | ORAL_CAPSULE | ORAL | Status: AC
  Administered 2024-01-25: 25 mg via ORAL
  Filled 2024-01-25: qty 1

## 2024-01-25 MED ORDER — OXYCODONE-ACETAMINOPHEN 5-325 MG PO TABS: 1.0000 | ORAL_TABLET | ORAL | 0 refills | Status: AC | PRN

## 2024-01-25 MED ORDER — ONDANSETRON HCL 4 MG PO TABS: 4.0000 mg | ORAL_TABLET | Freq: Every day | ORAL | 1 refills | Status: DC | PRN

## 2024-01-25 MED ORDER — SODIUM CHLORIDE 0.9 % IV SOLN: INTRAVENOUS | Status: DC

## 2024-01-25 MED ORDER — DIAZEPAM 5 MG PO TABS
10.0000 mg | ORAL_TABLET | Freq: Once | ORAL | Status: AC
  Administered 2024-01-25: 10 mg via ORAL
  Filled 2024-01-25: qty 2

## 2024-01-25 NOTE — Interval H&P Note (Signed)
 History and Physical Interval Note:  01/25/2024 10:17 AM  Alm LELON Somerset  has presented today for surgery, with the diagnosis of Right Nephrolithiasis.  The various methods of treatment have been discussed with the patient and family. After consideration of risks, benefits and other options for treatment, the patient has consented to  Procedure(s): LITHOTRIPSY, ESWL (Right) as a surgical intervention.  The patient's history has been reviewed, patient examined, no change in status, stable for surgery.  I have reviewed the patient's chart and labs.  Questions were answered to the patient's satisfaction.     Belvie Clara

## 2024-01-25 NOTE — Telephone Encounter (Signed)
 Left detailed message making pt aware to get CT stone study after litho 11/11.

## 2024-01-26 ENCOUNTER — Encounter (HOSPITAL_COMMUNITY): Payer: Self-pay | Admitting: Urology

## 2024-02-23 ENCOUNTER — Encounter: Admitting: Urology

## 2024-02-23 ENCOUNTER — Ambulatory Visit (HOSPITAL_COMMUNITY)
Admission: RE | Admit: 2024-02-23 | Discharge: 2024-02-23 | Disposition: A | Discharge status: Home / Self Care | Source: Ambulatory Visit | Attending: Urology | Admitting: Urology

## 2024-02-23 DIAGNOSIS — Z125 Encounter for screening for malignant neoplasm of prostate: Secondary | ICD-10-CM | POA: Diagnosis not present

## 2024-02-23 DIAGNOSIS — R7303 Prediabetes: Secondary | ICD-10-CM | POA: Diagnosis not present

## 2024-02-23 DIAGNOSIS — K4090 Unilateral inguinal hernia, without obstruction or gangrene, not specified as recurrent: Secondary | ICD-10-CM | POA: Diagnosis not present

## 2024-02-23 DIAGNOSIS — N200 Calculus of kidney: Secondary | ICD-10-CM | POA: Insufficient documentation

## 2024-02-23 DIAGNOSIS — E782 Mixed hyperlipidemia: Secondary | ICD-10-CM | POA: Diagnosis not present

## 2024-02-23 DIAGNOSIS — K5730 Diverticulosis of large intestine without perforation or abscess without bleeding: Secondary | ICD-10-CM | POA: Diagnosis not present

## 2024-02-29 DIAGNOSIS — H6123 Impacted cerumen, bilateral: Secondary | ICD-10-CM | POA: Diagnosis not present

## 2024-02-29 DIAGNOSIS — I129 Hypertensive chronic kidney disease with stage 1 through stage 4 chronic kidney disease, or unspecified chronic kidney disease: Secondary | ICD-10-CM | POA: Diagnosis not present

## 2024-02-29 DIAGNOSIS — I10 Essential (primary) hypertension: Secondary | ICD-10-CM | POA: Diagnosis not present

## 2024-02-29 DIAGNOSIS — Z87442 Personal history of urinary calculi: Secondary | ICD-10-CM | POA: Diagnosis not present

## 2024-02-29 DIAGNOSIS — K760 Fatty (change of) liver, not elsewhere classified: Secondary | ICD-10-CM | POA: Diagnosis not present

## 2024-02-29 DIAGNOSIS — L570 Actinic keratosis: Secondary | ICD-10-CM | POA: Diagnosis not present

## 2024-02-29 DIAGNOSIS — M25562 Pain in left knee: Secondary | ICD-10-CM | POA: Diagnosis not present

## 2024-02-29 DIAGNOSIS — Z0001 Encounter for general adult medical examination with abnormal findings: Secondary | ICD-10-CM | POA: Diagnosis not present

## 2024-02-29 DIAGNOSIS — E782 Mixed hyperlipidemia: Secondary | ICD-10-CM | POA: Diagnosis not present

## 2024-02-29 DIAGNOSIS — Z0000 Encounter for general adult medical examination without abnormal findings: Secondary | ICD-10-CM | POA: Diagnosis not present

## 2024-02-29 DIAGNOSIS — R7303 Prediabetes: Secondary | ICD-10-CM | POA: Diagnosis not present

## 2024-02-29 DIAGNOSIS — N182 Chronic kidney disease, stage 2 (mild): Secondary | ICD-10-CM | POA: Diagnosis not present

## 2024-03-17 ENCOUNTER — Telehealth: Payer: Self-pay

## 2024-03-17 NOTE — Telephone Encounter (Signed)
 Patient called to get xray results and follow up instructions patient states he went to PCP and they read the results to him just wanted to know if he needed a f/u appointment

## 2024-03-17 NOTE — Telephone Encounter (Signed)
"  See previous encounter for details.   "

## 2024-03-25 ENCOUNTER — Other Ambulatory Visit: Payer: Self-pay | Admitting: Urology
